# Patient Record
Sex: Female | Born: 1985 | Race: Black or African American | Hispanic: No | Marital: Single | Smoking: Never smoker
Health system: Southern US, Community
[De-identification: ages and names within clinical notes are randomized; demographics above are authoritative.]

## PROBLEM LIST (undated history)

## (undated) ENCOUNTER — Inpatient Hospital Stay (HOSPITAL_COMMUNITY): Payer: Self-pay

## (undated) DIAGNOSIS — R011 Cardiac murmur, unspecified: Secondary | ICD-10-CM

## (undated) DIAGNOSIS — R87629 Unspecified abnormal cytological findings in specimens from vagina: Secondary | ICD-10-CM

## (undated) DIAGNOSIS — M419 Scoliosis, unspecified: Secondary | ICD-10-CM

## (undated) HISTORY — PX: COLPOSCOPY: SHX161

## (undated) HISTORY — PX: DILATION AND CURETTAGE OF UTERUS: SHX78

---
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
## 2006-07-19 ENCOUNTER — Encounter: Admission: RE | Admit: 2006-07-19 | Discharge: 2006-07-19 | Payer: Self-pay | Admitting: Orthopaedic Surgery

## 2006-09-07 LAB — CONVERTED CEMR LAB

## 2006-09-11 ENCOUNTER — Other Ambulatory Visit: Admission: RE | Admit: 2006-09-11 | Discharge: 2006-09-11 | Payer: Self-pay | Admitting: Obstetrics and Gynecology

## 2007-10-18 ENCOUNTER — Ambulatory Visit: Payer: Self-pay | Admitting: Internal Medicine

## 2007-10-18 LAB — CONVERTED CEMR LAB
ALT: 12 units/L (ref 0–35)
Albumin: 3.7 g/dL (ref 3.5–5.2)
Alkaline Phosphatase: 58 units/L (ref 39–117)
BUN: 11 mg/dL (ref 6–23)
Basophils Relative: 0 % (ref 0.0–3.0)
Bilirubin, Direct: 0.2 mg/dL (ref 0.0–0.3)
CO2: 29 meq/L (ref 19–32)
Calcium: 9.2 mg/dL (ref 8.4–10.5)
Crystals: NEGATIVE
Eosinophils Absolute: 0.5 10*3/uL (ref 0.0–0.7)
Eosinophils Relative: 7 % — ABNORMAL HIGH (ref 0.0–5.0)
Glucose, Bld: 88 mg/dL (ref 70–99)
HCT: 37 % (ref 36.0–46.0)
Hemoglobin: 12.1 g/dL (ref 12.0–15.0)
Ketones, ur: NEGATIVE mg/dL
MCV: 82.1 fL (ref 78.0–100.0)
Monocytes Absolute: 0.8 10*3/uL (ref 0.1–1.0)
Monocytes Relative: 11.4 % (ref 3.0–12.0)
Neutro Abs: 2.8 10*3/uL (ref 1.4–7.7)
Potassium: 4.1 meq/L (ref 3.5–5.1)
RBC: 4.5 M/uL (ref 3.87–5.11)
Sodium: 139 meq/L (ref 135–145)
Total Protein, Urine: NEGATIVE mg/dL
Total Protein: 7.4 g/dL (ref 6.0–8.3)
Urine Glucose: NEGATIVE mg/dL
Urobilinogen, UA: 1 (ref 0.0–1.0)
WBC: 6.7 10*3/uL (ref 4.5–10.5)

## 2007-10-19 ENCOUNTER — Encounter: Payer: Self-pay | Admitting: Internal Medicine

## 2007-10-19 ENCOUNTER — Telehealth: Payer: Self-pay | Admitting: Internal Medicine

## 2007-11-17 ENCOUNTER — Ambulatory Visit: Payer: Self-pay | Admitting: Internal Medicine

## 2008-02-07 ENCOUNTER — Inpatient Hospital Stay (HOSPITAL_COMMUNITY): Admission: AD | Admit: 2008-02-07 | Discharge: 2008-02-07 | Payer: Self-pay | Admitting: Obstetrics and Gynecology

## 2008-02-29 ENCOUNTER — Other Ambulatory Visit: Admission: RE | Admit: 2008-02-29 | Discharge: 2008-02-29 | Payer: Self-pay | Admitting: Obstetrics and Gynecology

## 2008-07-03 ENCOUNTER — Other Ambulatory Visit: Admission: RE | Admit: 2008-07-03 | Discharge: 2008-07-03 | Payer: Self-pay | Admitting: Obstetrics and Gynecology

## 2008-09-06 ENCOUNTER — Inpatient Hospital Stay (HOSPITAL_COMMUNITY): Admission: AD | Admit: 2008-09-06 | Discharge: 2008-09-09 | Payer: Self-pay | Admitting: Obstetrics and Gynecology

## 2008-09-07 ENCOUNTER — Encounter (INDEPENDENT_AMBULATORY_CARE_PROVIDER_SITE_OTHER): Payer: Self-pay | Admitting: Obstetrics and Gynecology

## 2008-09-22 ENCOUNTER — Encounter: Admission: RE | Admit: 2008-09-22 | Discharge: 2008-10-21 | Payer: Self-pay | Admitting: Obstetrics and Gynecology

## 2008-10-22 ENCOUNTER — Encounter: Admission: RE | Admit: 2008-10-22 | Discharge: 2008-11-21 | Payer: Self-pay | Admitting: Obstetrics and Gynecology

## 2008-11-22 ENCOUNTER — Encounter: Admission: RE | Admit: 2008-11-22 | Discharge: 2008-12-21 | Payer: Self-pay | Admitting: Obstetrics and Gynecology

## 2008-12-07 ENCOUNTER — Other Ambulatory Visit: Admission: RE | Admit: 2008-12-07 | Discharge: 2008-12-07 | Payer: Self-pay | Admitting: Obstetrics and Gynecology

## 2008-12-22 ENCOUNTER — Encounter: Admission: RE | Admit: 2008-12-22 | Discharge: 2009-01-04 | Payer: Self-pay | Admitting: Obstetrics and Gynecology

## 2009-01-22 ENCOUNTER — Encounter: Admission: RE | Admit: 2009-01-22 | Discharge: 2009-02-21 | Payer: Self-pay | Admitting: Obstetrics and Gynecology

## 2009-02-22 ENCOUNTER — Encounter: Admission: RE | Admit: 2009-02-22 | Discharge: 2009-03-24 | Payer: Self-pay | Admitting: Obstetrics and Gynecology

## 2009-04-22 ENCOUNTER — Encounter: Admission: RE | Admit: 2009-04-22 | Discharge: 2009-05-22 | Payer: Self-pay | Admitting: Obstetrics and Gynecology

## 2009-05-18 ENCOUNTER — Ambulatory Visit: Payer: Self-pay | Admitting: Internal Medicine

## 2009-05-23 ENCOUNTER — Encounter: Admission: RE | Admit: 2009-05-23 | Discharge: 2009-06-22 | Payer: Self-pay | Admitting: Obstetrics and Gynecology

## 2009-06-23 ENCOUNTER — Encounter: Admission: RE | Admit: 2009-06-23 | Discharge: 2009-07-23 | Payer: Self-pay | Admitting: Obstetrics and Gynecology

## 2009-06-29 ENCOUNTER — Ambulatory Visit: Payer: Self-pay | Admitting: Internal Medicine

## 2009-06-29 LAB — CONVERTED CEMR LAB
ALT: 20 units/L (ref 0–35)
BUN: 11 mg/dL (ref 6–23)
Basophils Absolute: 0 10*3/uL (ref 0.0–0.1)
CO2: 29 meq/L (ref 19–32)
Chloride: 108 meq/L (ref 96–112)
Creatinine, Ser: 0.7 mg/dL (ref 0.4–1.2)
Eosinophils Absolute: 0.5 10*3/uL (ref 0.0–0.7)
Eosinophils Relative: 6.1 % — ABNORMAL HIGH (ref 0.0–5.0)
Glucose, Bld: 88 mg/dL (ref 70–99)
HCT: 39.5 % (ref 36.0–46.0)
Lymphs Abs: 3.2 10*3/uL (ref 0.7–4.0)
MCHC: 34.3 g/dL (ref 30.0–36.0)
MCV: 78.3 fL (ref 78.0–100.0)
Monocytes Absolute: 0.8 10*3/uL (ref 0.1–1.0)
Neutrophils Relative %: 42.8 % — ABNORMAL LOW (ref 43.0–77.0)
Platelets: 298 10*3/uL (ref 150.0–400.0)
Potassium: 4.6 meq/L (ref 3.5–5.1)
RDW: 14.3 % (ref 11.5–14.6)
Sed Rate: 10 mm/hr (ref 0–22)
TSH: 2.55 microintl units/mL (ref 0.35–5.50)
Total Bilirubin: 0.8 mg/dL (ref 0.3–1.2)
hCG, Beta Chain, Quant, S: <0.5 milliintl units/mL

## 2009-07-12 ENCOUNTER — Other Ambulatory Visit: Admission: RE | Admit: 2009-07-12 | Discharge: 2009-07-12 | Payer: Self-pay | Admitting: Obstetrics and Gynecology

## 2009-07-24 ENCOUNTER — Encounter: Admission: RE | Admit: 2009-07-24 | Discharge: 2009-08-23 | Payer: Self-pay | Admitting: Obstetrics and Gynecology

## 2009-07-27 ENCOUNTER — Ambulatory Visit: Payer: Self-pay | Admitting: Internal Medicine

## 2009-08-24 ENCOUNTER — Encounter: Admission: RE | Admit: 2009-08-24 | Discharge: 2009-09-23 | Payer: Self-pay | Admitting: Obstetrics and Gynecology

## 2009-09-24 ENCOUNTER — Encounter: Admission: RE | Admit: 2009-09-24 | Discharge: 2009-09-26 | Payer: Self-pay | Admitting: Obstetrics and Gynecology

## 2010-02-07 NOTE — Assessment & Plan Note (Signed)
Summary: 1 mos f/u #/cd   Vital Signs:  Patient profile:   25 year old female Height:      62 inches Weight:      162 pounds O2 Sat:      97 % on Room air Temp:     97.7 degrees F oral Pulse rate:   78 / minute Pulse rhythm:   regular Resp:     16 per minute BP sitting:   110 / 72  (left arm) Cuff size:   large  Vitals Entered By: Rock Nephew CMA (July 27, 2009 11:35 AM)  O2 Flow:  Room air  Primary Care Provider:  Etta Grandchild MD   History of Present Illness: She feels well and says that her headaches have resolved and she no N/W/T in her arms or legs. She has decided no to do the MRI and has not benn needing any meds for pain.  Preventive Screening-Counseling & Management  Alcohol-Tobacco     Alcohol drinks/day: 0     Smoking Status: never     Passive Smoke Exposure: no  Hep-HIV-STD-Contraception     Hepatitis Risk: no risk noted     HIV Risk: no     HIV Risk Counseling: not indicated-no HIV risk noted     STD Risk: no risk noted  Clinical Review Panels:  Diabetes Management   Creatinine:  0.7 (06/29/2009)  CBC   WBC:  7.9 (06/29/2009)   RBC:  5.04 (06/29/2009)   Hgb:  13.6 (06/29/2009)   Hct:  39.5 (06/29/2009)   Platelets:  298.0 (06/29/2009)   MCV  78.3 (06/29/2009)   MCHC  34.3 (06/29/2009)   RDW  14.3 (06/29/2009)   PMN:  42.8 (06/29/2009)   Lymphs:  40.2 (06/29/2009)   Monos:  10.3 (06/29/2009)   Eosinophils:  6.1 (06/29/2009)   Basophil:  0.6 (06/29/2009)  Complete Metabolic Panel   Glucose:  88 (06/29/2009)   Sodium:  141 (06/29/2009)   Potassium:  4.6 (06/29/2009)   Chloride:  108 (06/29/2009)   CO2:  29 (06/29/2009)   BUN:  11 (06/29/2009)   Creatinine:  0.7 (06/29/2009)   Albumin:  4.2 (06/29/2009)   Total Protein:  8.1 (06/29/2009)   Calcium:  9.5 (06/29/2009)   Total Bili:  0.8 (06/29/2009)   Alk Phos:  65 (06/29/2009)   SGPT (ALT):  20 (06/29/2009)   SGOT (AST):  25 (06/29/2009)   Medications Prior to Update: 1)   None  Current Medications (verified): 1)  None  Allergies (verified): No Known Drug Allergies  Past History:  Past Medical History: Last updated: 10/18/2007 splenomegaly for 10 years Innocent murmur  Past Surgical History: Last updated: 10/18/2007 Denies surgical history  Family History: Last updated: 10/18/2007 Family History of Arthritis Family History Diabetes 1st degree relative Family History Hypertension  Social History: Last updated: 06/29/2009 Single Never Smoked Alcohol use-no Drug use-no Regular exercise-yes  Risk Factors: Alcohol Use: 0 (07/27/2009) Exercise: yes (10/18/2007)  Risk Factors: Smoking Status: never (07/27/2009) Passive Smoke Exposure: no (07/27/2009)  Family History: Reviewed history from 10/18/2007 and no changes required. Family History of Arthritis Family History Diabetes 1st degree relative Family History Hypertension  Social History: Reviewed history from 06/29/2009 and no changes required. Single Never Smoked Alcohol use-no Drug use-no Regular exercise-yes  Review of Systems Neuro:  Denies brief paralysis, headaches, numbness, poor balance, seizures, sensation of room spinning, tremors, visual disturbances, and weakness.  Physical Exam  General:  alert, well-developed, well-nourished, well-hydrated, appropriate dress, normal appearance,  healthy-appearing, and cooperative to examination.   Head:  normocephalic, atraumatic, no abnormalities observed, and no abnormalities palpated.   Mouth:  Oral mucosa and oropharynx without lesions or exudates.  Teeth in good repair. Neck:  supple, full ROM, no masses, and no thyromegaly.   Lungs:  Normal respiratory effort, chest expands symmetrically. Lungs are clear to auscultation, no crackles or wheezes. Heart:   normal rate, regular rhythm, no gallop, no rub, and Grade   1/6 systolic ejection murmur.   Abdomen:  soft, non-tender, normal bowel sounds, no distention, no masses, no  guarding, no rigidity, no rebound tenderness, no abdominal hernia, no inguinal hernia, no hepatomegaly, and no splenomegaly.   Msk:  normal ROM, no joint tenderness, no joint swelling, no joint warmth, no redness over joints, and no joint deformities.   Extremities:  No clubbing, cyanosis, edema, or deformity noted with normal full range of motion of all joints.   Neurologic:  alert & oriented X3, cranial nerves II-XII intact, strength normal in all extremities, sensation intact to light touch, sensation intact to pinprick, gait normal, and DTRs symmetrical and normal.   Skin:  turgor normal, color normal, no rashes, no suspicious lesions, no ecchymoses, no petechiae, no purpura, no ulcerations, no edema, and tattoo(s).   Cervical Nodes:  no anterior cervical adenopathy and no posterior cervical adenopathy.   Psych:  Cognition and judgment appear intact. Alert and cooperative with normal attention span and concentration. No apparent delusions, illusions, hallucinations   Impression & Recommendations:  Problem # 1:  WEAKNESS, LEFT SIDE OF BODY (ICD-342.90) Assessment Improved she will let me know if this symptom returns and/or she decides that she wants to do the MRI.  Problem # 2:  HEADACHE (ICD-784.0) Assessment: Improved  Patient Instructions: 1)  Please schedule a follow-up appointment as needed.

## 2010-02-07 NOTE — Assessment & Plan Note (Signed)
Summary: BUMP MAY BE INFECTED/NWS  #   Vital Signs:  Patient profile:   25 year old female Weight:      162 pounds O2 Sat:      97 % on Room air Temp:     98.7 degrees F oral Pulse rate:   90 / minute Pulse rhythm:   regular Resp:     16 per minute BP sitting:   120 / 80  (left arm) Cuff size:   large  Vitals Entered By: Rock Nephew CMA (May 18, 2009 11:36 AM)  O2 Flow:  Room air  Primary Care Provider:  Etta Grandchild MD   History of Present Illness: She returns c/o a bump on her left forearm for about two weeks with pain and itching. She has applied neosporin ointment and it is resolving over the last 2 days. It is a red place that has not enlarged, drained, streaked, or opened.  Preventive Screening-Counseling & Management  Alcohol-Tobacco     Alcohol drinks/day: 0     Smoking Status: never     Passive Smoke Exposure: no  Hep-HIV-STD-Contraception     Hepatitis Risk: no risk noted     HIV Risk: no     HIV Risk Counseling: not indicated-no HIV risk noted      Drug Use:  no.    Medications Prior to Update: 1)  None  Current Medications (verified): 1)  None  Allergies (verified): No Known Drug Allergies  Past History:  Past Medical History: Reviewed history from 10/18/2007 and no changes required. splenomegaly for 10 years Innocent murmur  Past Surgical History: Reviewed history from 10/18/2007 and no changes required. Denies surgical history  Family History: Reviewed history from 10/18/2007 and no changes required. Family History of Arthritis Family History Diabetes 1st degree relative Family History Hypertension  Social History: Reviewed history from 10/18/2007 and no changes required. she is with her baby boy today Single Never Smoked Alcohol use-no Drug use-no Regular exercise-yes Hepatitis Risk:  no risk noted  Review of Systems  The patient denies anorexia, fever, syncope, prolonged cough, and enlarged lymph nodes.   Derm:   Complains of insect bite(s) and itching; denies flushing, lesion(s), poor wound healing, and rash.  Physical Exam  General:  alert, well-developed, well-nourished, well-hydrated, appropriate dress, normal appearance, healthy-appearing, and cooperative to examination.   Neck:  supple, full ROM, no masses, and no thyromegaly.   Lungs:  Normal respiratory effort, chest expands symmetrically. Lungs are clear to auscultation, no crackles or wheezes. Heart:  Normal rate and regular rhythm. S1 and S2 normal without gallop, murmur, click, rub or other extra sounds. Abdomen:  soft, non-tender, normal bowel sounds, no distention, no masses, no guarding, no rigidity, no rebound tenderness, no abdominal hernia, no inguinal hernia, no hepatomegaly, and no splenomegaly.   Skin:  on her left forearm there is a subtle erythematous lesion that measures about 3 mm. It is not warm, indurated, ttp, streaking, or vesicular. the skin is intact with no pustules or scale and no FB. Axillary Nodes:  no L axillary adenopathy.     Impression & Recommendations:  Problem # 1:  OTH MX&UNS SITE INSECT BITE NONVENOMOUS W/O INF (ICD-919.4) Assessment New apply otc hydrocortisone cream to the AA.  Patient Instructions: 1)  Please schedule a follow-up appointment as needed. Apply cortaid cream to the bug bite.

## 2010-02-07 NOTE — Assessment & Plan Note (Signed)
Summary: CPX/ HAVING HEADACHES/NWS  #   Vital Signs:  Patient profile:   25 year old female Height:      62 inches Weight:      164 pounds BMI:     30.10 O2 Sat:      96 % on Room air Temp:     98.1 degrees F oral Pulse rate:   99 / minute Pulse rhythm:   regular Resp:     16 per minute BP sitting:   120 / 82  (left arm) Cuff size:   large  Vitals Entered By: Rock Nephew CMA (June 29, 2009 9:12 AM)  Nutrition Counseling: Patient's BMI is greater than 25 and therefore counseled on weight management options.  O2 Flow:  Room air CC: CPX w/o pap, pt also c/o recent headaches w/ some nausea, Headaches Is Patient Diabetic? No Pain Assessment Patient in pain? yes     Location: head   Primary Care Provider:  Etta Grandchild MD  CC:  CPX w/o pap, pt also c/o recent headaches w/ some nausea, and Headaches.  History of Present Illness:  Headaches      This is a 25 year old woman who presents with Headaches.  The symptoms began 6-12 months ago.  On a scale of 1 to 10, the intensity is described as a 2.  The patient reports nausea and nasal congestion, but denies vomiting, sweats, tearing of eyes, sinus pain, sinus pressure, photophobia, and phonophobia.  The headache is described as constant and dull.  The location of the pain is bitemporal.  High-risk features (red flags) include focal weakness and new type of headache.  The patient denies the following high-risk features: fever, neck pain/stiffness, vision loss or change, altered mental status, rash, trauma, pain worse with exertion, age >50 years, immunosuppression, concomitant infection, and anticoagulation use.  Prior treatment has included a NSAID and acetaminophen.    Preventive Screening-Counseling & Management  Alcohol-Tobacco     Alcohol drinks/day: 0     Smoking Status: never     Passive Smoke Exposure: no  Hep-HIV-STD-Contraception     Hepatitis Risk: no risk noted     HIV Risk: no     HIV Risk Counseling: not  indicated-no HIV risk noted     STD Risk: no risk noted      Sexual History:  Not active.        Drug Use:  never.        Blood Transfusions:  no.    Medications Prior to Update: 1)  None  Current Medications (verified): 1)  None  Allergies (verified): No Known Drug Allergies  Past History:  Past Medical History: Last updated: 10/18/2007 splenomegaly for 10 years Innocent murmur  Past Surgical History: Last updated: 10/18/2007 Denies surgical history  Family History: Last updated: 10/18/2007 Family History of Arthritis Family History Diabetes 1st degree relative Family History Hypertension  Social History: Last updated: 06/29/2009 Single Never Smoked Alcohol use-no Drug use-no Regular exercise-yes  Risk Factors: Alcohol Use: 0 (06/29/2009) Exercise: yes (10/18/2007)  Risk Factors: Smoking Status: never (06/29/2009) Passive Smoke Exposure: no (06/29/2009)  Social History: Single Never Smoked Alcohol use-no Drug use-no Regular exercise-yes STD Risk:  no risk noted Sexual History:  Not active Drug Use:  never Blood Transfusions:  no  Review of Systems       The patient complains of muscle weakness.  The patient denies anorexia, fever, weight loss, weight gain, chest pain, peripheral edema, prolonged cough, hemoptysis, abdominal pain,  melena, suspicious skin lesions, transient blindness, difficulty walking, and depression.   Neuro:  Complains of headaches and weakness; denies brief paralysis, difficulty with concentration, disturbances in coordination, falling down, inability to speak, memory loss, numbness, poor balance, seizures, sensation of room spinning, tingling, tremors, and visual disturbances.  Physical Exam  General:  alert, well-developed, well-nourished, well-hydrated, appropriate dress, normal appearance, healthy-appearing, and cooperative to examination.   Head:  normocephalic, atraumatic, no abnormalities observed, and no abnormalities  palpated.   Eyes:  vision grossly intact, pupils equal, pupils round, pupils reactive to light, pupils react to accomodation, no retinal abnormalitiies, and no nystagmus.   Ears:  R ear normal and L ear normal.   Nose:  External nasal examination shows no deformity or inflammation. Nasal mucosa are pink and moist without lesions or exudates. Mouth:  Oral mucosa and oropharynx without lesions or exudates.  Teeth in good repair. Neck:  supple, full ROM, no masses, and no thyromegaly.   Lungs:  Normal respiratory effort, chest expands symmetrically. Lungs are clear to auscultation, no crackles or wheezes. Heart:   normal rate, regular rhythm, no gallop, no rub, and Grade   1/6 systolic ejection murmur.   Abdomen:  soft, non-tender, normal bowel sounds, no distention, no masses, no guarding, no rigidity, no rebound tenderness, no abdominal hernia, no inguinal hernia, no hepatomegaly, and no splenomegaly.   Msk:  normal ROM, no joint tenderness, no joint swelling, no joint warmth, no redness over joints, and no joint deformities.   Pulses:  R and L carotid,radial,femoral,dorsalis pedis and posterior tibial pulses are full and equal bilaterally Extremities:  No clubbing, cyanosis, edema, or deformity noted with normal full range of motion of all joints.   Neurologic:  alert & oriented X3, cranial nerves II-XII intact, sensation intact to light touch, sensation intact to pinprick, gait normal, DTRs symmetrical and normal, finger-to-nose normal, heel-to-shin normal, toes down bilaterally on Babinski, Romberg negative, and LUE weakness.   Skin:  turgor normal, color normal, no rashes, no suspicious lesions, no ecchymoses, no petechiae, no purpura, no ulcerations, no edema, and tattoo(s).   Cervical Nodes:  no anterior cervical adenopathy and no posterior cervical adenopathy.   Axillary Nodes:  no R axillary adenopathy and no L axillary adenopathy.   Inguinal Nodes:  no R inguinal adenopathy and no L inguinal  adenopathy.   Psych:  Cognition and judgment appear intact. Alert and cooperative with normal attention span and concentration. No apparent delusions, illusions, hallucinations   Impression & Recommendations:  Problem # 1:  HEADACHE (ICD-784.0) Assessment New this sounds like rebound HA from chronic use of OTC meds like tylenol and nsaids but her LUE weakness is concerning so she will need a brain MRI to look for avm, tumor, mass, etc. I will also look at labs today to screen for secondary organic causes. she does not request treatment for pain at this time but may start topamax if needed. Orders: Venipuncture (04540) TLB-BMP (Basic Metabolic Panel-BMET) (80048-METABOL) TLB-CBC Platelet - w/Differential (85025-CBCD) TLB-Hepatic/Liver Function Pnl (80076-HEPATIC) TLB-TSH (Thyroid Stimulating Hormone) (84443-TSH) TLB-Preg Serum Quant (B-hCG) (84702-HCG-QN) TLB-Sedimentation Rate (ESR) (85652-ESR) TLB-CRP-High Sensitivity (C-Reactive Protein) (86140-FCRP)  Problem # 2:  WEAKNESS, LEFT SIDE OF BODY (ICD-342.90) Assessment: New as above Orders: Venipuncture ([id number]) TLB-BMP (Basic Metabolic Panel-BMET) (80048-METABOL) TLB-CBC Platelet - w/Differential (85025-CBCD) TLB-Hepatic/Liver Function Pnl (80076-HEPATIC) TLB-TSH (Thyroid Stimulating Hormone) (84443-TSH) TLB-Preg Serum Quant (B-hCG) (84702-HCG-QN) TLB-Sedimentation Rate (ESR) (85652-ESR) TLB-CRP-High Sensitivity (C-Reactive Protein) (86140-FCRP)  Patient Instructions: 1)  Please schedule a follow-up appointment in  1 month. 2)  It is important that you exercise regularly at least 20 minutes 5 times a week. If you develop chest pain, have severe difficulty breathing, or feel very tired , stop exercising immediately and seek medical attention. 3)  You need to lose weight. Consider a lower calorie diet and regular exercise.   Preventive Care Screening  Pap Smear:    Date:  11/06/2008    Results:  normal   Last Tetanus Booster:     Date:  01/07/2004    Results:  Historical

## 2010-04-12 LAB — CBC
HCT: 34.4 % — ABNORMAL LOW (ref 36.0–46.0)
Hemoglobin: 11.6 g/dL — ABNORMAL LOW (ref 12.0–15.0)
MCHC: 33.9 g/dL (ref 30.0–36.0)
MCV: 87.1 fL (ref 78.0–100.0)
MCV: 88.5 fL (ref 78.0–100.0)
Platelets: 200 10*3/uL (ref 150–400)
RBC: 3.88 MIL/uL (ref 3.87–5.11)
WBC: 12.8 10*3/uL — ABNORMAL HIGH (ref 4.0–10.5)

## 2010-04-12 LAB — URINALYSIS, DIPSTICK ONLY
Bilirubin Urine: NEGATIVE
Hgb urine dipstick: NEGATIVE
Ketones, ur: 40 mg/dL — AB
Specific Gravity, Urine: 1.02 (ref 1.005–1.030)
pH: 6.5 (ref 5.0–8.0)

## 2010-04-12 LAB — COMPREHENSIVE METABOLIC PANEL
AST: 27 U/L (ref 0–37)
Albumin: 3.2 g/dL — ABNORMAL LOW (ref 3.5–5.2)
Calcium: 9 mg/dL (ref 8.4–10.5)
Chloride: 102 mEq/L (ref 96–112)
Creatinine, Ser: 0.52 mg/dL (ref 0.4–1.2)
GFR calc Af Amer: >60 mL/min (ref >60)
Sodium: 133 mEq/L — ABNORMAL LOW (ref 135–145)
Total Bilirubin: 0.7 mg/dL (ref 0.3–1.2)

## 2010-04-23 LAB — CBC
HCT: 33 % — ABNORMAL LOW (ref 36.0–46.0)
Platelets: 242 10*3/uL (ref 150–400)
RBC: 4 MIL/uL (ref 3.87–5.11)
WBC: 11.6 10*3/uL — ABNORMAL HIGH (ref 4.0–10.5)

## 2010-04-23 LAB — URINALYSIS, ROUTINE W REFLEX MICROSCOPIC
Bilirubin Urine: NEGATIVE
Glucose, UA: NEGATIVE mg/dL
Ketones, ur: 15 mg/dL — AB
Protein, ur: NEGATIVE mg/dL

## 2010-04-23 LAB — GC/CHLAMYDIA PROBE AMP, GENITAL: GC Probe Amp, Genital: NEGATIVE

## 2010-04-23 LAB — URINE MICROSCOPIC-ADD ON

## 2010-04-23 LAB — WET PREP, GENITAL
Trich, Wet Prep: NONE SEEN
Yeast Wet Prep HPF POC: NONE SEEN

## 2010-05-21 NOTE — H&P (Signed)
Katie Rosales, Katie Rosales NO.:  [id number]   MEDICAL RECORD NO.:  [id number]          PATIENT TYPE:  SPE   LOCATION:  [facility]                         FACILITY:  [facility]   PHYSICIAN:  Gerald Leitz, MD          DATE OF BIRTH:  1985-04-23   DATE OF ADMISSION:  09/06/2008  DATE OF DISCHARGE:                              HISTORY & PHYSICAL   HISTORY OF PRESENT ILLNESS:  This is a 25 year old G2, P0-0-1-0 at 38  weeks and 4 days intrauterine pregnancy based on the last menstrual  period of December 11, 2007 confirmed by 9-week ultrasound with estimated  date of delivery September 16, 2008.  Pregnancy was complicated by  presenting to our office today with a blood pressure of 128/100  complaining of a headache unrelieved with Tylenol.  She also has  borderline oligohydramnios with an AFI of 5.  Positive fetal movement.  No leakage of fluid.  No contractions.   PAST MEDICAL HISTORY:  Scoliosis.   PAST SURGICAL HISTORY:  D and C in 2003 for an elective abortion.   PAST OB HISTORY:  Elective abortion in 2003.  Current pregnancy.   MEDICATIONS:  Prenatal vitamin.   ALLERGIES:  No known drug allergies.   PAST GYN HISTORY:  Chlamydia treated in 2008, history of cervical  dysplasia in 2008 as well.  Her last Pap smear was low grade SIL that  was done in February of 2010.   FAMILY HISTORY:  Sister with sickle cell trait.   SOCIAL HISTORY:  The patient is single.  She denies tobacco, alcohol, or  other drug use.   REVIEW OF SYSTEMS:  Negative, except as stated in history of present  illness.   PHYSICAL EXAMINATION:  VITAL SIGNS:  Blood pressure 138/100, weight 180  pounds, height 61-1/2 inches.  CARDIOVASCULAR:  Regular rate and rhythm.  LUNGS:  Clear to auscultation bilaterally.  ABDOMEN:  Gravid, nontender.  EXTREMITIES:  With 1+ edema.  2+ reflexes.  PELVIC:  Cervix is 2 cm and 50% effaced.  Negative 1 station.   ASSESSMENT AND PLAN:  A 38-week-4-day intrauterine pregnancy  with  gestational hypertension.  Will admit to labor and delivery for  induction.   PLAN:  Will do Cytotec followed by low-dose pitocin.  Check labs and PIH  panel.  Penicillin for GBS prophylaxis in active labor or with SROM.  Anticipate spontaneous vaginal delivery, Dr. Antoine Poche covering until  5 a.m. September 2.      Gerald Leitz, MD  Electronically Signed     TC/MEDQ  D:  09/06/2008  T:  09/06/2008  Job:  [id number]

## 2011-03-31 ENCOUNTER — Telehealth (HOSPITAL_COMMUNITY): Payer: Self-pay | Admitting: *Deleted

## 2011-03-31 NOTE — Telephone Encounter (Signed)
Trying to locate pt info

## 2012-07-22 ENCOUNTER — Other Ambulatory Visit: Payer: Self-pay | Admitting: Obstetrics and Gynecology

## 2012-07-23 ENCOUNTER — Ambulatory Visit (HOSPITAL_COMMUNITY): Admission: RE | Admit: 2012-07-23 | Payer: 59 | Source: Ambulatory Visit | Admitting: Obstetrics and Gynecology

## 2012-07-29 ENCOUNTER — Ambulatory Visit: Payer: Self-pay | Admitting: Obstetrics & Gynecology

## 2012-08-02 ENCOUNTER — Encounter (HOSPITAL_COMMUNITY): Admission: RE | Payer: Self-pay | Source: Ambulatory Visit

## 2012-08-02 SURGERY — DILATION AND EVACUATION, UTERUS
Anesthesia: Choice

## 2013-12-05 LAB — OB RESULTS CONSOLE GC/CHLAMYDIA
Chlamydia: NEGATIVE
GC PROBE AMP, GENITAL: NEGATIVE

## 2013-12-05 LAB — OB RESULTS CONSOLE HGB/HCT, BLOOD
HCT: 36 %
HEMOGLOBIN: 12.2 g/dL

## 2013-12-05 LAB — OB RESULTS CONSOLE PLATELET COUNT: PLATELETS: 305 10*3/uL

## 2013-12-05 LAB — OB RESULTS CONSOLE HEPATITIS B SURFACE ANTIGEN: HEP B S AG: NEGATIVE

## 2013-12-05 LAB — OB RESULTS CONSOLE ABO/RH: RH TYPE: POSITIVE

## 2013-12-05 LAB — OB RESULTS CONSOLE ANTIBODY SCREEN: Antibody Screen: NEGATIVE

## 2013-12-05 LAB — OB RESULTS CONSOLE HIV ANTIBODY (ROUTINE TESTING): HIV: NONREACTIVE

## 2013-12-05 LAB — OB RESULTS CONSOLE RPR: RPR: NONREACTIVE

## 2013-12-05 LAB — OB RESULTS CONSOLE RUBELLA ANTIBODY, IGM: Rubella: IMMUNE

## 2014-01-06 NOTE — L&D Delivery Note (Signed)
2241: Nurse call reporting patient crying and reporting contractions have worsened despite procardia dosing.  In room to assess.  Patient appears distressed and reporting constant vaginal pressure.  VE C/C/+4.  Patient informed of imminent delivery and necessary personnell contacted.  Patient moved to labor room and upon NICU arrival, patient instructed on pushing methods.   FHR attempted and unsuccessfully obtained.  Patient delivered as below with staff and family support.   Delivery Note At 10:55 PM, April 24, 2014, a viable female "[name]" was delivered via Vaginal, Spontaneous Delivery (Presentation: Left Occiput Anterior). After delivery of head, nuchal cord noted and infant delivered through, easily, via somersault maneuver.  Infant with good tone and attempting to cry.  Cord clamped and cut by provider.  Infant than walked to warmer, by provider, for NICU team assessment.  Infant with spontaneous cry upon placement in warmer and given APGARs: 6, 7.  Cord blood collected and placenta remained undelivered.  Pitocin bolus started 15 minutes after delivery to promote placenta expulsion.  After one hour of active management, Dr. Delrae Sawyers. Cole consulted regarding placenta delivery.  Recommendation to give hemabate 250 mcg and await 30 minutes for expulsion.  Hemabate given per recommendation.  Patient reporting urge to urinate and placed on bedpan. Despite warm water and peppermint, patient unable to urinate.  Provider gave manual external compression of bladder, which elicited urination.  Signs of placenta separation noted and patient urged to push.  Placenta delivered at 0030 and noted to be intact with marginal 3VC upon inspection.  Vaginal inspection revealed small hemostatic abrasions and patient informed of labial and perineal location as well as care techniques.  Fundus firm, at the umbilicus, and bleeding small.  Mother hemodynamically stable prior to provider exit.  Mother unsure of birth control method and  opts to bottlefeed.  Infant weight at one hour of life: 3 lb 13.7 oz (1750 g).    Anesthesia: None  Episiotomy: None Lacerations: None Suture Repair: None Est. Blood Loss (mL): 531 Placenta status: Intact, Pathology  Mom to Women's Unit.  Baby to NICU.  Dorotha Hirschi LYNN MSN, CNM 04/25/2014, 1:01 AM

## 2014-03-06 ENCOUNTER — Encounter (HOSPITAL_COMMUNITY): Payer: Self-pay | Admitting: *Deleted

## 2014-03-06 ENCOUNTER — Inpatient Hospital Stay (HOSPITAL_COMMUNITY): Payer: PRIVATE HEALTH INSURANCE

## 2014-03-06 ENCOUNTER — Inpatient Hospital Stay (HOSPITAL_COMMUNITY)
Admission: AD | Admit: 2014-03-06 | Discharge: 2014-03-06 | Disposition: A | Discharge status: Home / Self Care | Payer: PRIVATE HEALTH INSURANCE | Source: Ambulatory Visit | Attending: Obstetrics and Gynecology | Admitting: Obstetrics and Gynecology

## 2014-03-06 DIAGNOSIS — Z3A24 24 weeks gestation of pregnancy: Secondary | ICD-10-CM | POA: Insufficient documentation

## 2014-03-06 DIAGNOSIS — R109 Unspecified abdominal pain: Secondary | ICD-10-CM | POA: Insufficient documentation

## 2014-03-06 DIAGNOSIS — O4692 Antepartum hemorrhage, unspecified, second trimester: Secondary | ICD-10-CM

## 2014-03-06 DIAGNOSIS — O26852 Spotting complicating pregnancy, second trimester: Secondary | ICD-10-CM | POA: Diagnosis present

## 2014-03-06 DIAGNOSIS — O9989 Other specified diseases and conditions complicating pregnancy, childbirth and the puerperium: Secondary | ICD-10-CM | POA: Diagnosis not present

## 2014-03-06 HISTORY — DX: Scoliosis, unspecified: M41.9

## 2014-03-06 HISTORY — DX: Cardiac murmur, unspecified: R01.1

## 2014-03-06 HISTORY — DX: Unspecified abnormal cytological findings in specimens from vagina: R87.629

## 2014-03-06 LAB — URINALYSIS, ROUTINE W REFLEX MICROSCOPIC
BILIRUBIN URINE: NEGATIVE
GLUCOSE, UA: NEGATIVE mg/dL
KETONES UR: 15 mg/dL — AB
Nitrite: NEGATIVE
PROTEIN: NEGATIVE mg/dL
Specific Gravity, Urine: 1.025 (ref 1.005–1.030)
UROBILINOGEN UA: 1 mg/dL (ref 0.0–1.0)
pH: 6 (ref 5.0–8.0)

## 2014-03-06 LAB — URINE MICROSCOPIC-ADD ON

## 2014-03-06 MED ORDER — ACETAMINOPHEN 325 MG PO TABS: 650.0000 mg | ORAL_TABLET | Freq: Once | ORAL | Status: DC

## 2014-03-06 MED ORDER — IBUPROFEN 600 MG PO TABS: 600.0000 mg | ORAL_TABLET | Freq: Four times a day (QID) | ORAL | Status: DC | PRN

## 2014-03-06 MED ORDER — ACETAMINOPHEN 500 MG PO TABS
1000.0000 mg | ORAL_TABLET | Freq: Once | ORAL | Status: AC
  Administered 2014-03-06: 1000 mg via ORAL
  Filled 2014-03-06: qty 2

## 2014-03-06 NOTE — MAU Note (Signed)
Urine in lab,. 

## 2014-03-06 NOTE — Discharge Instructions (Signed)
Pelvic Rest °Pelvic rest is sometimes recommended for women when:  °· The placenta is partially or completely covering the opening of the cervix (placenta previa). °· There is bleeding between the uterine wall and the amniotic sac in the first trimester (subchorionic hemorrhage). °· The cervix begins to open without labor starting (incompetent cervix, cervical insufficiency). °· The labor is too early (preterm labor). °HOME CARE INSTRUCTIONS °· Do not have sexual intercourse, stimulation, or an orgasm. °· Do not use tampons, douche, or put anything in the vagina. °· Do not lift anything over 10 pounds (4.5 kg). °· Avoid strenuous activity or straining your pelvic muscles. °SEEK MEDICAL CARE IF:  °· You have any vaginal bleeding during pregnancy. Treat this as a potential emergency. °· You have cramping pain felt low in the stomach (stronger than menstrual cramps). °· You notice vaginal discharge (watery, mucus, or bloody). °· You have a low, dull backache. °· There are regular contractions or uterine tightening. °SEEK IMMEDIATE MEDICAL CARE IF: °You have vaginal bleeding and have placenta previa.  °Document Released: 04/19/2010 Document Revised: 03/17/2011 Document Reviewed: 04/19/2010 °ExitCare® Patient Information ©2015 [employer]. This information is not intended to replace advice given to you by your health care provider. Make sure you discuss any questions you have with your health care provider. ° °Vaginal Bleeding During Pregnancy, Second Trimester ° A small amount of bleeding (spotting) from the vagina is common in pregnancy. Sometimes the bleeding is normal and is not a problem, and sometimes it is a sign of something serious. Be sure to tell your doctor about any bleeding from your vagina right away. °HOME CARE °· Watch your condition for any changes. °· Follow your doctor's instructions about how active you can be. °· If you are on bed rest: °¨ You may need to stay in bed and only get up to use the  bathroom. °¨ You may be allowed to do some activities. °¨ If you need help, make plans for someone to help you. °· Write down: °¨ The number of pads you use each day. °¨ How often you change pads. °¨ How soaked (saturated) your pads are. °· Do not use tampons. °· Do not douche. °· Do not have sex or orgasms until your doctor says it is okay. °· If you pass any tissue from your vagina, save the tissue so you can show it to your doctor. °· Only take medicines as told by your doctor. °· Do not take aspirin because it can make you bleed. °· Do not exercise, lift heavy weights, or do any activities that take a lot of energy and effort unless your doctor says it is okay. °· Keep all follow-up visits as told by your doctor. °GET HELP IF:  °· You bleed from your vagina. °· You have cramps. °· You have labor pains. °· You have a fever that does not go away after you take medicine. °GET HELP RIGHT AWAY IF: °· You have very bad cramps in your back or belly (abdomen). °· You have contractions. °· You have chills. °· You pass large clots or tissue from your vagina. °· You bleed more. °· You feel light-headed or weak. °· You pass out (faint). °· You are leaking fluid or have a gush of fluid from your vagina. °MAKE SURE YOU: °· Understand these instructions. °· Will watch your condition. °· Will get help right away if you are not doing well or get worse. °Document Released: 05/09/2013 Document Reviewed: 08/30/2012 °ExitCare® Patient Information ©2015   [employer]. This information is not intended to replace advice given to you by your health care provider. Make sure you discuss any questions you have with your health care provider. ° °

## 2014-03-06 NOTE — Progress Notes (Signed)
FFN from office reported to CNM as negative.

## 2014-03-06 NOTE — MAU Provider Note (Signed)
Continuation of care from [facility]. Standard.  U/S: Breech, anterior placenta, marginal insertion, cvx 3.43 cm (normal appearance), subjectively normal amniotic fluid; largest pocket = 5.15 cm, uterus: no abnormality visualized, no free fluid.   Cvx rechecked: 0/0/-4. No bleeding noted to exam glove.  1 gram of Tylenol given in MAU w/ good results.  P: Ibuprofen prn PTL precautions Monthly growth scans beginning at 28 wks due to marginal cord insertion OB f/u prn  Sherre ScarletKimberly Maylen Waltermire, CNM 03/06/14, 09:30 PM

## 2014-03-06 NOTE — MAU Provider Note (Signed)
Katie Rosales is a 29 y.o. G4P1021 at 24.3 weeks pt Katie FritzLakasha Rosales called.  She was in the office c/o having one episode of spotting this am with continuous cramping. Pt denies any recent lab test or hospital visit. pg,cma A/P: Cervix: soft, friable, external os 1 cm, closed internally. FFN/GC/CT/CBC: pending. Wet prep: negative. NST: contractions q2-4 minutes. Pt report only feeling 2 ctx since she arrived   History     Patient Active Problem List   Diagnosis Date Noted  . WEAKNESS, LEFT SIDE OF BODY 06/29/2009  . HEADACHE 06/29/2009  . CONSTIPATION 10/18/2007  . HEART MURMUR, BENIGN 10/18/2007    Chief Complaint  Patient presents with  . Abdominal Cramping   HPI  OB History    Gravida Para Term Preterm AB TAB SAB Ectopic Multiple Living   4 1 1  2 1 1   1       Past Medical History  Diagnosis Date  . Vaginal Pap smear, abnormal     Past Surgical History  Procedure Laterality Date  . Colposcopy    . Dilation and curettage of uterus      History reviewed. No pertinent family history.  History  Substance Use Topics  . Smoking status: Never Smoker   . Smokeless tobacco: Never Used  . Alcohol Use: No    Allergies: No Known Allergies  Prescriptions prior to admission  Medication Sig Dispense Refill Last Dose  . Prenatal Vit-Fe Fumarate-FA (PRENATAL MULTIVITAMIN) TABS tablet Take 1 tablet by mouth daily at 12 noon.   03/06/2014 at Unknown time    ROS See HPI above, all other systems are negative  Physical Exam   Blood pressure 126/78, pulse 70, temperature 98.7 F (37.1 C), temperature source Oral, resp. rate 16, height 5\' 1"  (1.549 m), weight 184 lb (83.462 kg).   Physical Exam Ext:  WNL ABD: Soft, non tender to palpation, no rebound or guarding SVE: deferred, closed and soft in the office today    ED Course  Assessment: IUP at  24.3weeks Membranes: intact FHR: Category 1 CTX:  Non observed fFN negative   Plan: OB US for bleeding Fetal  monitoring Report to [employer]   Katie Rosales, CNM, MSN 03/06/2014. 6:15 PM

## 2014-03-06 NOTE — MAU Note (Signed)
Had some cramping, they checked her cervix - was a little red and raw; ctx's noted on monitor.  Sent over for eval.

## 2014-04-13 ENCOUNTER — Inpatient Hospital Stay (HOSPITAL_COMMUNITY): Payer: PRIVATE HEALTH INSURANCE

## 2014-04-13 ENCOUNTER — Inpatient Hospital Stay (HOSPITAL_COMMUNITY)
Admission: AD | Admit: 2014-04-13 | Discharge: 2014-04-13 | Disposition: A | Discharge status: Home / Self Care | Payer: PRIVATE HEALTH INSURANCE | Source: Ambulatory Visit | Attending: Obstetrics and Gynecology | Admitting: Obstetrics and Gynecology

## 2014-04-13 ENCOUNTER — Encounter (HOSPITAL_COMMUNITY): Payer: Self-pay | Admitting: *Deleted

## 2014-04-13 DIAGNOSIS — D582 Other hemoglobinopathies: Secondary | ICD-10-CM | POA: Diagnosis present

## 2014-04-13 DIAGNOSIS — Z3A29 29 weeks gestation of pregnancy: Secondary | ICD-10-CM | POA: Insufficient documentation

## 2014-04-13 DIAGNOSIS — O4693 Antepartum hemorrhage, unspecified, third trimester: Secondary | ICD-10-CM | POA: Diagnosis not present

## 2014-04-13 DIAGNOSIS — O26853 Spotting complicating pregnancy, third trimester: Secondary | ICD-10-CM | POA: Diagnosis present

## 2014-04-13 DIAGNOSIS — IMO0002 Reserved for concepts with insufficient information to code with codable children: Secondary | ICD-10-CM | POA: Diagnosis present

## 2014-04-13 LAB — URINALYSIS, ROUTINE W REFLEX MICROSCOPIC
BILIRUBIN URINE: NEGATIVE
Glucose, UA: NEGATIVE mg/dL
Ketones, ur: NEGATIVE mg/dL
NITRITE: NEGATIVE
PH: 6.5 (ref 5.0–8.0)
Protein, ur: NEGATIVE mg/dL
SPECIFIC GRAVITY, URINE: 1.025 (ref 1.005–1.030)
Urobilinogen, UA: 1 mg/dL (ref 0.0–1.0)

## 2014-04-13 LAB — URINE MICROSCOPIC-ADD ON

## 2014-04-13 NOTE — MAU Provider Note (Signed)
  History   29 yo G4P1021 at 2329 6/7 weeks presented unannounced c/o browish-red spotting this am, mild cramping.  Denies recent IC.  Reports cramping "no worse' than usual daily cramping.  No hx PTL with previous pregnancy.  Episode of spotting at 24 weeks, cervix friable, with US for cervical length = 3.43 US on 3/31 cervical length 2.39, no funneling, normal growth.  Negative FFN.  Patient Active Problem List   Diagnosis Date Noted  . Marginal insertion of umbilical cord 04/13/2014  . 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 Vaginal bleeding in pregnancy   . HEART MURMUR, BENIGN 10/18/2007    Chief Complaint  Patient presents with  . Vaginal Bleeding   HPI: As above  OB History    Gravida Para Term Preterm AB TAB SAB Ectopic Multiple Living   4 1 1  2 1 1   1       Past Medical History  Diagnosis Date  . Vaginal Pap smear, abnormal   . Heart murmur   . Scoliosis     Past Surgical History  Procedure Laterality Date  . Colposcopy    . Dilation and curettage of uterus      No family history on file.  History  Substance Use Topics  . Smoking status: Never Smoker   . Smokeless tobacco: Never Used  . Alcohol Use: No    Allergies: No Known Allergies  Prescriptions prior to admission  Medication Sig Dispense Refill Last Dose  . Prenatal Vit-Fe Fumarate-FA (PRENATAL MULTIVITAMIN) TABS tablet Take 1 tablet by mouth daily at 12 noon.   04/12/2014 at Unknown time  . ibuprofen (ADVIL,MOTRIN) 600 MG tablet Take 1 tablet (600 mg total) by mouth every 6 (six) hours as needed for fever, headache, mild pain, moderate pain or cramping. 20 tablet 0     ROS:  Brownish-red spotting, mild cramping, +FM Physical Exam   Blood pressure 133/78, pulse 76, temperature 98.3 F (36.8 C), temperature source Oral, resp. rate 18, height 5\' 1"  (1.549 m), weight 190 lb (86.183 kg).    Physical Exam  In NAD Chest clear Heart RRR without murmur Abd gravid, NT Pelvic--moderate amount thin brown  fluid/discharge in vault, appears c/w old blood.  Cervix very posterior, feels closed, thick, vtx. Ext WNL  FHR Category 1 UCs ? Very mild irritability  ED Course  Assessment: IUP at 29 6/7 weeks Vaginal bleeding today--now brown ? Irritability Hx cervical length 2.3 cm 04/06/14.  Plan: GBS, GC/chlamydia Monitoring for uterine activity. Limited US   Ray ChurchLATHAM, Kilyn Maragh CNM, MSN 04/13/2014 11:24 AM  Addendum: Returned from US.  No bleeding noted, single small dark brown stain (approx 1 cm) on pad. US WNL, vtx, normal fluid.  No cervical assessment due to > 29 weeks per US protocol.  FHR Category 1 No UCs, very minimal irritability.  Plan: D/C home on pelvic rest, rest at home tomorrow.  Note for work today and tomorrow. [facility]--will recheck cervical length at that time. To f/u with any active bleeding, UCs, decreased FM, or any other issue.  Nigel BridgemanVicki Macee Venables, CNM 04/13/14 1:50p

## 2014-04-13 NOTE — Discharge Instructions (Signed)
°  Vaginal Bleeding During Pregnancy, Third Trimester °A small amount of bleeding (spotting) from the vagina is relatively common in pregnancy. Various things can cause bleeding or spotting in pregnancy. Sometimes the bleeding is normal and is not a problem. However, bleeding during the third trimester can also be a sign of something serious for the mother and the baby. Be sure to tell your health care provider about any vaginal bleeding right away.  °Some possible causes of vaginal bleeding during the third trimester include:  °· The placenta may be partially or completely covering the opening to the cervix (placenta previa).   °· The placenta may have separated from the uterus (abruption of the placenta).   °· There may be an infection or growth on the cervix.   °· You may be starting labor, called discharging of the mucus plug.   °· The placenta may grow into the muscle layer of the uterus (placenta accreta).   °HOME CARE INSTRUCTIONS  °Watch your condition for any changes. The following actions may help to lessen any discomfort you are feeling:  °· Follow your health care provider's instructions for limiting your activity. If your health care provider orders bed rest, you may need to stay in bed and only get up to use the bathroom. However, your health care provider may allow you to continue light activity. °· If needed, make plans for someone to help with your regular activities and responsibilities while you are on bed rest. °· Keep track of the number of pads you use each day, how often you change pads, and how soaked (saturated) they are. Write this down. °· Do not use tampons. Do not douche. °· Do not have sexual intercourse or orgasms until approved by your health care provider. °· Follow your health care provider's advice about lifting, driving, and physical activities. °· If you pass any tissue from your vagina, save the tissue so you can show it to your health care provider.   °· Only take  over-the-counter or prescription medicines as directed by your health care provider. °· Do not take aspirin because it can make you bleed.   °· Keep all follow-up appointments as directed by your health care provider. °SEEK MEDICAL CARE IF: °· You have any vaginal bleeding during any part of your pregnancy. °· You have cramps or labor pains. °· You have a fever, not controlled by medicine. °SEEK IMMEDIATE MEDICAL CARE IF:  °· You have severe cramps or pain in your back or belly (abdomen). °· You have chills. °· You have a gush of fluid from the vagina. °· You pass large clots or tissue from your vagina. °· Your bleeding increases. °· You feel light-headed or weak. °· You pass out. °· You feel less movement or no movement of the baby.   °MAKE SURE YOU: °· Understand these instructions. °· Will watch your condition. °· Will get help right away if you are not doing well or get worse. °Document Released: 03/15/2002 Document Revised: 12/28/2012 Document Reviewed: 08/30/2012 °ExitCare® Patient Information ©2015 [employer]. This information is not intended to replace advice given to you by your health care provider. Make sure you discuss any questions you have with your health care provider. ° ° °

## 2014-04-13 NOTE — MAU Note (Signed)
Pt reprots she noted some vag bleeding/spotting and mild cramping when she wiped this morning. Good fetal movement reported.

## 2014-04-14 LAB — GC/CHLAMYDIA PROBE AMP (~~LOC~~) NOT AT ARMC
Chlamydia: NEGATIVE
Neisseria Gonorrhea: NEGATIVE

## 2014-04-15 LAB — CULTURE, BETA STREP (GROUP B ONLY)

## 2014-04-24 ENCOUNTER — Encounter (HOSPITAL_COMMUNITY): Payer: Self-pay | Admitting: *Deleted

## 2014-04-24 ENCOUNTER — Inpatient Hospital Stay (HOSPITAL_COMMUNITY): Payer: PRIVATE HEALTH INSURANCE

## 2014-04-24 ENCOUNTER — Inpatient Hospital Stay (HOSPITAL_COMMUNITY)
Admission: AD | Admit: 2014-04-24 | Discharge: 2014-04-26 | DRG: 775 | Disposition: A | Discharge status: Home / Self Care | Payer: PRIVATE HEALTH INSURANCE | Source: Ambulatory Visit | Attending: Obstetrics and Gynecology | Admitting: Obstetrics and Gynecology

## 2014-04-24 DIAGNOSIS — O42013 Preterm premature rupture of membranes, onset of labor within 24 hours of rupture, third trimester: Secondary | ICD-10-CM

## 2014-04-24 DIAGNOSIS — Z3A31 31 weeks gestation of pregnancy: Secondary | ICD-10-CM | POA: Diagnosis present

## 2014-04-24 DIAGNOSIS — Z8759 Personal history of other complications of pregnancy, childbirth and the puerperium: Secondary | ICD-10-CM

## 2014-04-24 DIAGNOSIS — O42913 Preterm premature rupture of membranes, unspecified as to length of time between rupture and onset of labor, third trimester: Secondary | ICD-10-CM | POA: Diagnosis present

## 2014-04-24 DIAGNOSIS — O24429 Gestational diabetes mellitus in childbirth, unspecified control: Secondary | ICD-10-CM | POA: Diagnosis present

## 2014-04-24 DIAGNOSIS — O4290 Premature rupture of membranes, unspecified as to length of time between rupture and onset of labor, unspecified weeks of gestation: Secondary | ICD-10-CM | POA: Diagnosis present

## 2014-04-24 LAB — COMPREHENSIVE METABOLIC PANEL
ALK PHOS: 107 U/L (ref 39–117)
ALT: 27 U/L (ref 0–35)
ANION GAP: 9 (ref 5–15)
AST: 26 U/L (ref 0–37)
Albumin: 3.1 g/dL — ABNORMAL LOW (ref 3.5–5.2)
BUN: 7 mg/dL (ref 6–23)
CALCIUM: 8.9 mg/dL (ref 8.4–10.5)
CO2: 21 mmol/L (ref 19–32)
Chloride: 104 mmol/L (ref 96–112)
Creatinine, Ser: 0.45 mg/dL — ABNORMAL LOW (ref 0.50–1.10)
GFR calc non Af Amer: >90 mL/min (ref >90)
Glucose, Bld: 69 mg/dL — ABNORMAL LOW (ref 70–99)
Potassium: 3.9 mmol/L (ref 3.5–5.1)
Sodium: 134 mmol/L — ABNORMAL LOW (ref 135–145)
TOTAL PROTEIN: 7.2 g/dL (ref 6.0–8.3)
Total Bilirubin: 0.6 mg/dL (ref 0.3–1.2)

## 2014-04-24 LAB — URINALYSIS, ROUTINE W REFLEX MICROSCOPIC
Bilirubin Urine: NEGATIVE
Glucose, UA: NEGATIVE mg/dL
KETONES UR: NEGATIVE mg/dL
Leukocytes, UA: NEGATIVE
Nitrite: NEGATIVE
Protein, ur: NEGATIVE mg/dL
Specific Gravity, Urine: 1.01 (ref 1.005–1.030)
Urobilinogen, UA: 0.2 mg/dL (ref 0.0–1.0)
pH: 6.5 (ref 5.0–8.0)

## 2014-04-24 LAB — CBC WITH DIFFERENTIAL/PLATELET
Basophils Absolute: 0 10*3/uL (ref 0.0–0.1)
Basophils Relative: 0 % (ref 0–1)
Eosinophils Absolute: 0.2 10*3/uL (ref 0.0–0.7)
Eosinophils Relative: 2 % (ref 0–5)
HCT: 32.1 % — ABNORMAL LOW (ref 36.0–46.0)
Hemoglobin: 11.4 g/dL — ABNORMAL LOW (ref 12.0–15.0)
Lymphocytes Relative: 21 % (ref 12–46)
Lymphs Abs: 2.4 10*3/uL (ref 0.7–4.0)
MCH: 27 pg (ref 26.0–34.0)
MCHC: 35.5 g/dL (ref 30.0–36.0)
MCV: 76.1 fL — ABNORMAL LOW (ref 78.0–100.0)
Monocytes Absolute: 1.3 10*3/uL — ABNORMAL HIGH (ref 0.1–1.0)
Monocytes Relative: 11 % (ref 3–12)
Neutro Abs: 7.5 10*3/uL (ref 1.7–7.7)
Neutrophils Relative %: 66 % (ref 43–77)
Platelets: 252 10*3/uL (ref 150–400)
RBC: 4.22 MIL/uL (ref 3.87–5.11)
RDW: 14.4 % (ref 11.5–15.5)
WBC: 11.4 10*3/uL — ABNORMAL HIGH (ref 4.0–10.5)

## 2014-04-24 LAB — LACTATE DEHYDROGENASE: LDH: 151 U/L (ref 94–250)

## 2014-04-24 LAB — URINE MICROSCOPIC-ADD ON

## 2014-04-24 LAB — URIC ACID: Uric Acid, Serum: 3.6 mg/dL (ref 2.4–7.0)

## 2014-04-24 LAB — AMNISURE RUPTURE OF MEMBRANE (ROM) NOT AT ARMC: Amnisure ROM: POSITIVE

## 2014-04-24 LAB — TYPE AND SCREEN
ABO/RH(D): A POS
Antibody Screen: NEGATIVE

## 2014-04-24 LAB — GROUP B STREP BY PCR: Group B strep by PCR: NEGATIVE

## 2014-04-24 MED ORDER — MORPHINE SULFATE 4 MG/ML IJ SOLN
4.0000 mg | Freq: Once | INTRAMUSCULAR | Status: AC
  Administered 2014-04-24: 4 mg via INTRAVENOUS

## 2014-04-24 MED ORDER — SODIUM CHLORIDE 0.9 % IV SOLN
2.0000 g | Freq: Four times a day (QID) | INTRAVENOUS | Status: DC
Start: 2014-04-24 — End: 2014-04-25
  Administered 2014-04-24 (×2): 2 g via INTRAVENOUS
  Filled 2014-04-24 (×5): qty 2000

## 2014-04-24 MED ORDER — LACTATED RINGERS IV SOLN: 500.0000 mL | INTRAVENOUS | Status: DC | PRN

## 2014-04-24 MED ORDER — TERBUTALINE SULFATE 1 MG/ML IJ SOLN: 0.2500 mg | Freq: Once | INTRAMUSCULAR | Status: DC

## 2014-04-24 MED ORDER — CITRIC ACID-SODIUM CITRATE 334-500 MG/5ML PO SOLN: 30.0000 mL | ORAL | Status: DC | PRN

## 2014-04-24 MED ORDER — TERBUTALINE SULFATE 1 MG/ML IJ SOLN
INTRAMUSCULAR | Status: AC
  Filled 2014-04-24: qty 1

## 2014-04-24 MED ORDER — OXYCODONE-ACETAMINOPHEN 5-325 MG PO TABS: 1.0000 | ORAL_TABLET | ORAL | Status: DC | PRN

## 2014-04-24 MED ORDER — LIDOCAINE HCL (PF) 1 % IJ SOLN
INTRAMUSCULAR | Status: AC
  Filled 2014-04-24: qty 30

## 2014-04-24 MED ORDER — ACETAMINOPHEN 325 MG PO TABS: 650.0000 mg | ORAL_TABLET | ORAL | Status: DC | PRN

## 2014-04-24 MED ORDER — ONDANSETRON HCL 4 MG/2ML IJ SOLN: 4.0000 mg | Freq: Four times a day (QID) | INTRAMUSCULAR | Status: DC | PRN

## 2014-04-24 MED ORDER — NIFEDIPINE 10 MG PO CAPS
10.0000 mg | ORAL_CAPSULE | ORAL | Status: DC | PRN
  Administered 2014-04-24 (×2): 10 mg via ORAL
  Filled 2014-04-24 (×3): qty 1

## 2014-04-24 MED ORDER — LACTATED RINGERS IV BOLUS (SEPSIS)
500.0000 mL | Freq: Once | INTRAVENOUS | Status: AC
  Administered 2014-04-24: 500 mL via INTRAVENOUS

## 2014-04-24 MED ORDER — OXYTOCIN 40 UNITS IN LACTATED RINGERS INFUSION - SIMPLE MED: 62.5000 mL/h | INTRAVENOUS | Status: DC

## 2014-04-24 MED ORDER — MORPHINE SULFATE 4 MG/ML IJ SOLN
INTRAMUSCULAR | Status: AC
  Filled 2014-04-24: qty 1

## 2014-04-24 MED ORDER — ZOLPIDEM TARTRATE 5 MG PO TABS: 5.0000 mg | ORAL_TABLET | Freq: Every evening | ORAL | Status: DC | PRN

## 2014-04-24 MED ORDER — LACTATED RINGERS IV SOLN: INTRAVENOUS | Status: DC

## 2014-04-24 MED ORDER — AMOXICILLIN 500 MG PO CAPS: 500.0000 mg | ORAL_CAPSULE | Freq: Three times a day (TID) | ORAL | Status: DC

## 2014-04-24 MED ORDER — DOCUSATE SODIUM 100 MG PO CAPS
100.0000 mg | ORAL_CAPSULE | Freq: Every day | ORAL | Status: DC
  Filled 2014-04-24 (×2): qty 1

## 2014-04-24 MED ORDER — LACTATED RINGERS IV SOLN
INTRAVENOUS | Status: DC
  Administered 2014-04-24 (×2): via INTRAVENOUS

## 2014-04-24 MED ORDER — CALCIUM CARBONATE ANTACID 500 MG PO CHEW
2.0000 | CHEWABLE_TABLET | ORAL | Status: DC | PRN
  Filled 2014-04-24: qty 2

## 2014-04-24 MED ORDER — BETAMETHASONE SOD PHOS & ACET 6 (3-3) MG/ML IJ SUSP
12.0000 mg | INTRAMUSCULAR | Status: DC
  Administered 2014-04-24: 12 mg via INTRAMUSCULAR
  Filled 2014-04-24 (×2): qty 2

## 2014-04-24 MED ORDER — OXYTOCIN 40 UNITS IN LACTATED RINGERS INFUSION - SIMPLE MED
INTRAVENOUS | Status: AC
  Filled 2014-04-24: qty 1000

## 2014-04-24 MED ORDER — AZITHROMYCIN 250 MG PO TABS
500.0000 mg | ORAL_TABLET | Freq: Once | ORAL | Status: AC
  Administered 2014-04-24: 500 mg via ORAL
  Filled 2014-04-24: qty 2

## 2014-04-24 MED ORDER — OXYCODONE-ACETAMINOPHEN 5-325 MG PO TABS
2.0000 | ORAL_TABLET | ORAL | Status: DC | PRN
Start: 2014-04-24 — End: 2014-04-25

## 2014-04-24 MED ORDER — PRENATAL MULTIVITAMIN CH
1.0000 | ORAL_TABLET | Freq: Every day | ORAL | Status: DC
  Filled 2014-04-24 (×2): qty 1

## 2014-04-24 MED ORDER — OXYTOCIN BOLUS FROM INFUSION
500.0000 mL | INTRAVENOUS | Status: DC
  Administered 2014-04-24: 500 mL via INTRAVENOUS

## 2014-04-24 MED ORDER — LIDOCAINE HCL (PF) 1 % IJ SOLN
30.0000 mL | INTRAMUSCULAR | Status: DC | PRN
  Filled 2014-04-24: qty 30

## 2014-04-24 MED ORDER — CARBOPROST TROMETHAMINE 250 MCG/ML IM SOLN
250.0000 ug | Freq: Once | INTRAMUSCULAR | Status: AC
  Administered 2014-04-24: 250 ug via INTRAMUSCULAR

## 2014-04-24 NOTE — Progress Notes (Addendum)
Hospital day # 0 pregnancy at 7650w3d--PROM today.  S:  Now on Antenatal.      Perception of contractions: Mild, irregular cramping.  Reports now more frequent than before.  In NAD.      Vaginal bleeding: None       Vaginal discharge:  watery  O: BP 122/69 mmHg  Pulse 82  Temp(Src) 98.1 F (36.7 C) (Oral)  Resp 18  Ht 5\' 1"  (1.549 m)  Wt 192 lb (87.091 kg)  BMI 36.30 kg/m2      Fetal tracings: Category 1       Contractions:   Occasional mild irritability, sporadic more defined UCs, still mild, q 6-12 min.      Uterus gravid and non-tender      Extremities: no significant edema and no signs of DVT          Labs:   Results for orders placed or performed during the hospital encounter of 04/24/14 (from the past 24 hour(s))  Urinalysis, Routine w reflex microscopic     Status: Abnormal   Collection Time: 04/24/14 11:30 AM  Result Value Ref Range   Color, Urine YELLOW YELLOW   APPearance CLEAR CLEAR   Specific Gravity, Urine 1.010 1.005 - 1.030   pH 6.5 5.0 - 8.0   Glucose, UA NEGATIVE NEGATIVE mg/dL   Hgb urine dipstick TRACE (A) NEGATIVE   Bilirubin Urine NEGATIVE NEGATIVE   Ketones, ur NEGATIVE NEGATIVE mg/dL   Protein, ur NEGATIVE NEGATIVE mg/dL   Urobilinogen, UA 0.2 0.0 - 1.0 mg/dL   Nitrite NEGATIVE NEGATIVE   Leukocytes, UA NEGATIVE NEGATIVE  Urine microscopic-add on     Status: Abnormal   Collection Time: 04/24/14 11:30 AM  Result Value Ref Range   Squamous Epithelial / LPF FEW (A) RARE   RBC / HPF 0-2 <3 RBC/hpf   Bacteria, UA RARE RARE  Amnisure rupture of membrane (rom)     Status: None   Collection Time: 04/24/14 11:44 AM  Result Value Ref Range   Amnisure ROM POSITIVE   Group B strep by PCR     Status: None   Collection Time: 04/24/14 11:44 AM  Result Value Ref Range   Group B strep by PCR NEGATIVE NEGATIVE  CBC with Differential     Status: Abnormal   Collection Time: 04/24/14  1:10 PM  Result Value Ref Range   WBC 11.4 (H) 4.0 - 10.5 K/uL   RBC 4.22 3.87  - 5.11 MIL/uL   Hemoglobin 11.4 (L) 12.0 - 15.0 g/dL   HCT 40.932.1 (L) 81.136.0 - 91.446.0 %   MCV 76.1 (L) 78.0 - 100.0 fL   MCH 27.0 26.0 - 34.0 pg   MCHC 35.5 30.0 - 36.0 g/dL   RDW 78.214.4 95.611.5 - 21.315.5 %   Platelets 252 150 - 400 K/uL   Neutrophils Relative % 66 43 - 77 %   Neutro Abs 7.5 1.7 - 7.7 K/uL   Lymphocytes Relative 21 12 - 46 %   Lymphs Abs 2.4 0.7 - 4.0 K/uL   Monocytes Relative 11 3 - 12 %   Monocytes Absolute 1.3 (H) 0.1 - 1.0 K/uL   Eosinophils Relative 2 0 - 5 %   Eosinophils Absolute 0.2 0.0 - 0.7 K/uL   Basophils Relative 0 0 - 1 %   Basophils Absolute 0.0 0.0 - 0.1 K/uL  Comprehensive metabolic panel     Status: Abnormal   Collection Time: 04/24/14  1:10 PM  Result Value Ref Range  Sodium 134 (L) 135 - 145 mmol/L   Potassium 3.9 3.5 - 5.1 mmol/L   Chloride 104 96 - 112 mmol/L   CO2 21 19 - 32 mmol/L   Glucose, Bld 69 (L) 70 - 99 mg/dL   BUN 7 6 - 23 mg/dL   Creatinine, Ser 1.61 (L) 0.50 - 1.10 mg/dL   Calcium 8.9 8.4 - 09.6 mg/dL   Total Protein 7.2 6.0 - 8.3 g/dL   Albumin 3.1 (L) 3.5 - 5.2 g/dL   AST 26 0 - 37 U/L   ALT 27 0 - 35 U/L   Alkaline Phosphatase 107 39 - 117 U/L   Total Bilirubin 0.6 0.3 - 1.2 mg/dL   GFR calc non Af Amer >90 >90 mL/min   GFR calc Af Amer >90 >90 mL/min   Anion gap 9 5 - 15  Lactate dehydrogenase     Status: None   Collection Time: 04/24/14  1:10 PM  Result Value Ref Range   LDH 151 94 - 250 U/L  Uric acid     Status: None   Collection Time: 04/24/14  1:10 PM  Result Value Ref Range   Uric Acid, Serum 3.6 2.4 - 7.0 mg/dL  Type and screen     Status: None   Collection Time: 04/24/14  1:10 PM  Result Value Ref Range   ABO/RH(D) A POS    Antibody Screen NEG    Sample Expiration 04/27/2014           Meds:  . ampicillin (OMNIPEN) IV  2 g Intravenous Q6H   Followed by  . [START ON 04/26/2014] amoxicillin  500 mg Oral Q8H  . azithromycin  500 mg Oral Once  . betamethasone acetate-betamethasone sodium phosphate  12 mg  Intramuscular Q24H  . docusate sodium  100 mg Oral Daily  . prenatal multivitamin  1 tablet Oral Q1200  Received single Azithromycin po dose and 1st dose IV Amoxicillin Received 1st dose betamethasone 1321  [place]:  Vtx, EFW 3+12, 49%ile, marginal insertion, AFI 3.99, anterior placenta.  MFM recommendation:  ATB for latency, delivery at 34 weeks.  A: [redacted]w[redacted]d with PROM     Stable  P: Continue current plan of care      Upcoming tests/treatments:  T&S q 72 hours, continuous EFM at present      MDs will follow  Nigel Bridgeman CNM, [place] 04/24/2014 4:24 PM

## 2014-04-24 NOTE — Progress Notes (Signed)
Subjective: Nurse call reporting patient c/o cramping, blood when wiping, and change in FHR baseline. Strip and Chart Reviewed.  In room to assess.  Patient breathing through contractions upon provider arrival.  Patient reports "cramping" in lower abdominal area and vaginal pressure when cramping occurs.  Patient states bloody mucous noted while wiping, but nothing noted on peri-pad.    Objective:  Filed Vitals:   04/24/14 1133 04/24/14 1555  BP: 130/83 122/69  Pulse: 87 82  Temp: 98.3 F (36.8 C) 98.1 F (36.7 C)  TempSrc:  Oral  Resp: 16 18  Height: 5\' 1"  (1.549 m)   Weight: 192 lb (87.091 kg)    Physical Exam SVE: Closed/70/-3 Bloody Show Noted  FHR: 110 bpm, Mod Var, -Decels, -Accels UC: One graphed, two palpated mild to moderate  Assessment: IUP at 4131w3dwks Cat I FT pPROM Contractions  Plan: Discussed VE findings Reassurances given regarding bloody show Fluid bolus-5300mL once Procardia per [facility] protocol Will reassess if symptoms not resolved with interventions Continue to monitor  Sabas SousJ. Zaeem Kandel, CNM 04/24/2014 8:55 PM

## 2014-04-24 NOTE — MAU Note (Signed)
Urine in lab 

## 2014-04-24 NOTE — MAU Note (Signed)
Leaking amniotic fluid since early morning, clear, changed 4 pads, some yellow discharge, having some lower abdominal cramping which is not new.

## 2014-04-24 NOTE — H&P (Signed)
Katie Rosales is a 29 y.o. female, [id number] at 46 3/7 weeks, presenting with leaking of fluid since around 11pm--initially very small amount, then had pronounced "gushes" when she arrived at work today.  Denies bleeding, reports sporadic mild cramping, "but the same as it's been for several weeks".  Reports +FM.  Patient Active Problem List   Diagnosis Date Noted  . PROM (premature rupture of membranes) at 31 3/7 weeks 04/24/2014  . History of gestational hypertension 04/24/2014  . Marginal insertion of umbilical cord 04/13/2014  . 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   EDC of 06/23/14 was established by LMP, and in agreement with [place] at NOB visit.  ? Uterine septum noted in certain views.  Cercix 3.78 Anatomy scan:  19 5/7 weeks, with normal findings and an anterior right lateral placenta, with no uterine septum noted. Female.  Cervix 3.26. Additional [place] evaluations:   24 weeks--in MAU due to vaginal bleeding.  Normal fluid, cervix not assessed.  Marginal cord insertion noted. 28 4/7 weeks--EFW 2+13, 34%ile, AFI 14, 45%ile, cervix 2.39, no funneling. Stable marginal cord insertion noted. Significant prenatal events:   Treated for UTI in early pregnancy with Macrobid.  Diarrhea and vomiting at 18 4/7 weeks, likely due to food exposure.  Possible uterine septum on early [place], but no evidence on later [place].  Marginal insertion dx on [place] at [facility] at 24 weeks.  Seen at office at 24 weeks for cramping and spotting--sent to MAU, with US showing marginal insertion.  FFN negative.  Seen at MAU at 29 6/7 weeks for spotting.  US WNL.  Cervical length 2.39 at 28 4/7 weeks, 2.75 at 30 6/7 weeks. Last evaluation:  04/20/14--cervical length stable at 2.75, BP 100/70.  OB History    Gravida Para Term Preterm AB TAB SAB Ectopic Multiple Living   2005--TAB, 5 weeks 2010--SVB, 38 5/7 weeks, 2 hour labor, 5+14, epidural, induced due to  gestational hypertension 2014--SAB, 8 weeks  Past Medical History  Diagnosis Date  . Vaginal Pap smear, abnormal   . Heart murmur   . Scoliosis    Past Surgical History  Procedure Laterality Date  . Colposcopy    . Dilation and curettage of uterus     Family History: Mother HTN; MGM aneurysm; MA aneurysm; PGM dementia; PGF MI; PA MI.   Social History:  reports that she has never smoked. She has never used smokeless tobacco. She reports that she does not drink alcohol or use illicit drugs.  Patient is African Naval architect, of the [place] faith, high-school educated, employed as Environmental health practitioner.  FOB, Mercie Eon, is not current present with her today.   Prenatal Transfer Tool  Maternal Diabetes: No Genetic Screening: Declined Maternal Ultrasounds/Referrals: Normal Fetal Ultrasounds or other Referrals:  None Maternal Substance Abuse:  No Significant Maternal Medications:  None Significant Maternal Lab Results: None--GBS negative 04/10/14, pending on recheck today.  TDAP NA Flu NA  ROS:  Leaking clear fluid, +FM  No Known Allergies     Blood pressure 130/83, pulse 87, temperature 98.3 F (36.8 C), resp. rate 16, height  (1.549 m), weight 192 lb (87.091 kg).    Chest clear Heart RRR without murmur Abd gravid, NT, FH 32 cm Pelvic: Deferred Ext: WNL  FHR: Reassuring, no decels UCs:  Very sporadic, mild.  Prenatal labs: ABO, Rh: --/--/A POS (04/18  1310) Antibody: NEG (04/18 1310) Rubella:   Immune RPR: Nonreactive (11/30 0000)  HBsAg: Negative (11/30 0000)  HIV: Non-reactive (11/30 0000)  GBS:  Negative 04/10/14, pending from today Sickle cell/Hgb electrophoresis:  Hgb C--unknown FOB status Pap:  WNL 01/11/14, BV GC:  Negative 12/05/13, 03/07/14, and 04/13/14--repeated today Chlamydia:  Negative 12/05/13, 03/07/14, and 04/13/14--repeated today Genetic screenings:  Declined Glucola:  WNL Other:   Hgb 12.2 at NOB, 11.728 weeks FFN negative 03/07/14     Assessment/Plan: IUP at 31 3/7 weeks PROM  Plan: Admit to [facility] per consult with Dr. Normand Sloopillard Antibiotics for latency Betamethasone course US for growth, fluid CBC, diff, PIH labs T&S q 72 hours Reviewed plan of care for PROM, with possibility of prolonged hospitalization, induction at 34 weeks if no spontaneous labor by then, risk of infection, fetal distress, need for cesarean birth.  Patient and her mother seem to understand these risks and are agreeable with the plan.  Nyra CapesLATHAM, VICKICNM, [place] 04/24/2014, 3:06 PM

## 2014-04-25 ENCOUNTER — Encounter (HOSPITAL_COMMUNITY): Payer: Self-pay | Admitting: *Deleted

## 2014-04-25 LAB — RPR: RPR: NONREACTIVE

## 2014-04-25 LAB — CBC
HEMATOCRIT: 29.4 % — AB (ref 36.0–46.0)
Hemoglobin: 10.4 g/dL — ABNORMAL LOW (ref 12.0–15.0)
MCH: 26.8 pg (ref 26.0–34.0)
MCHC: 35.4 g/dL (ref 30.0–36.0)
MCV: 75.8 fL — AB (ref 78.0–100.0)
Platelets: 223 10*3/uL (ref 150–400)
RBC: 3.88 MIL/uL (ref 3.87–5.11)
RDW: 14.3 % (ref 11.5–15.5)
WBC: 26.6 10*3/uL — AB (ref 4.0–10.5)

## 2014-04-25 LAB — HIV ANTIBODY (ROUTINE TESTING W REFLEX): HIV Screen 4th Generation wRfx: NONREACTIVE

## 2014-04-25 LAB — CULTURE, OB URINE
Colony Count: NO GROWTH
Culture: NO GROWTH

## 2014-04-25 MED ORDER — SIMETHICONE 80 MG PO CHEW: 80.0000 mg | CHEWABLE_TABLET | ORAL | Status: DC | PRN

## 2014-04-25 MED ORDER — OXYCODONE-ACETAMINOPHEN 5-325 MG PO TABS: 2.0000 | ORAL_TABLET | ORAL | Status: DC | PRN

## 2014-04-25 MED ORDER — WITCH HAZEL-GLYCERIN EX PADS: 1.0000 "application " | MEDICATED_PAD | CUTANEOUS | Status: DC | PRN

## 2014-04-25 MED ORDER — ZOLPIDEM TARTRATE 5 MG PO TABS: 5.0000 mg | ORAL_TABLET | Freq: Every evening | ORAL | Status: DC | PRN

## 2014-04-25 MED ORDER — MORPHINE SULFATE 4 MG/ML IJ SOLN: 4.0000 mg | Freq: Once | INTRAMUSCULAR | Status: DC

## 2014-04-25 MED ORDER — SENNOSIDES-DOCUSATE SODIUM 8.6-50 MG PO TABS
2.0000 | ORAL_TABLET | ORAL | Status: DC
  Administered 2014-04-25: 2 via ORAL
  Filled 2014-04-25: qty 2

## 2014-04-25 MED ORDER — OXYCODONE-ACETAMINOPHEN 5-325 MG PO TABS: 1.0000 | ORAL_TABLET | ORAL | Status: DC | PRN

## 2014-04-25 MED ORDER — TETANUS-DIPHTH-ACELL PERTUSSIS 5-2.5-18.5 LF-MCG/0.5 IM SUSP
0.5000 mL | Freq: Once | INTRAMUSCULAR | Status: AC
  Administered 2014-04-26: 0.5 mL via INTRAMUSCULAR

## 2014-04-25 MED ORDER — ONDANSETRON HCL 4 MG/2ML IJ SOLN: 4.0000 mg | INTRAMUSCULAR | Status: DC | PRN

## 2014-04-25 MED ORDER — IBUPROFEN 600 MG PO TABS
600.0000 mg | ORAL_TABLET | Freq: Four times a day (QID) | ORAL | Status: DC
  Administered 2014-04-25 – 2014-04-26 (×6): 600 mg via ORAL
  Filled 2014-04-25 (×6): qty 1

## 2014-04-25 MED ORDER — DIBUCAINE 1 % RE OINT: 1.0000 "application " | TOPICAL_OINTMENT | RECTAL | Status: DC | PRN

## 2014-04-25 MED ORDER — PRENATAL MULTIVITAMIN CH
1.0000 | ORAL_TABLET | Freq: Every day | ORAL | Status: DC
  Administered 2014-04-25 – 2014-04-26 (×2): 1 via ORAL
  Filled 2014-04-25 (×2): qty 1

## 2014-04-25 MED ORDER — FERROUS SULFATE 325 (65 FE) MG PO TABS
325.0000 mg | ORAL_TABLET | Freq: Two times a day (BID) | ORAL | Status: DC
  Administered 2014-04-25 – 2014-04-26 (×2): 325 mg via ORAL
  Filled 2014-04-25 (×2): qty 1

## 2014-04-25 MED ORDER — BENZOCAINE-MENTHOL 20-0.5 % EX AERO: 1.0000 "application " | INHALATION_SPRAY | CUTANEOUS | Status: DC | PRN

## 2014-04-25 MED ORDER — ONDANSETRON HCL 4 MG PO TABS: 4.0000 mg | ORAL_TABLET | ORAL | Status: DC | PRN

## 2014-04-25 MED ORDER — ACETAMINOPHEN 325 MG PO TABS: 650.0000 mg | ORAL_TABLET | ORAL | Status: DC | PRN

## 2014-04-25 MED ORDER — DIPHENHYDRAMINE HCL 25 MG PO CAPS: 25.0000 mg | ORAL_CAPSULE | Freq: Four times a day (QID) | ORAL | Status: DC | PRN

## 2014-04-25 MED ORDER — LANOLIN HYDROUS EX OINT: TOPICAL_OINTMENT | CUTANEOUS | Status: DC | PRN

## 2014-04-25 NOTE — Progress Notes (Signed)
Katie Rosales   Subjective: Post Partum Day 1 Vaginal delivery, no laceration Preterm at 31.3 infant in NICU Patient up ad lib, denies syncope or dizziness. Reports consuming regular diet without issues and denies N/V No issues with urination and reports bleeding is appropriate  Feeding:  bottle Contraceptive plan:   unsure  Objective: Temp:  [98 F (36.7 C)-98.8 F (37.1 C)] 98.4 F (36.9 C) (04/19 0610) Pulse Rate:  [73-92] 85 (04/19 0610) Resp:  [16-20] 18 (04/19 0610) BP: (104-130)/(51-83) 104/63 mmHg (04/19 0610) SpO2:  [98 %-100 %] 98 % (04/19 0610) Weight:  [192 lb (87.091 kg)] 192 lb (87.091 kg) (04/18 1133)  Physical Exam:  General: alert and cooperative Ext: WNL, no edema. No evidence of DVT seen on physical exam. Breast: Soft filling Lungs: CTAB Heart RRR without murmur Abdomen:  Soft, fundus firm, lochia scant, + bowel sounds, non distended, non tender Lochia: appropriate Uterine Fundus: firm Laceration: n/a    Recent Labs  04/24/14 1310 04/25/14 0530  HGB 11.4* 10.4*  HCT 32.1* 29.4*    Assessment S/P Vaginal Delivery-Day 1 Stable  Normal Involution Breast pumping Circumcision:  Plan: Continue current care Plan for discharge tomorrow  Iron supplement    Nguyen Butler, CNM, MSN 04/25/2014, 8:19 AM

## 2014-04-25 NOTE — Lactation Note (Addendum)
This note was copied from the chart of Katie Rosales. Lactation Consultation Note  Patient Name: Katie Gwen HerLatoya Soots AVWUJ'WToday's Date: 04/25/2014 Reason for consult: Initial assessment;NICU baby NICU baby 13 hours of life. Mom states that she had a good milk supply with 29-year-old. Baby was a difficult latch, so mom pumped. Set mom up with DEBP and enc mom to pump every 3 hours for 15 minutes. Mom has small bottles, labels, and stickers with instructions. Mom aware of NICU pumping rooms and ENC to pump after visiting and/or providing Kangaroo care/STS. Discussed transporting EBM from home to NICU. Mom given NICU booklet with review, aware of OP/BFSG and LC phone line assistance after D/C. Enc mom to ask for assistance with pumping as needed.   Maternal Data Has patient been taught Hand Expression?: Yes (Per mom.) Does the patient have breastfeeding experience prior to this delivery?: Yes  Feeding    LATCH Score/Interventions                      Lactation Tools Discussed/Used     Consult Status Consult Status: Follow-up Date: 04/26/14 Follow-up type: In-patient    Geralynn OchsWILLIARD, Evalyne Cortopassi 04/25/2014, 12:18 PM

## 2014-04-25 NOTE — Plan of Care (Signed)
Problem: Consults Goal: Birthing Suites Patient Information Press F2 to bring up selections list Outcome: Completed/Met Date Met:  04/25/14  Pt < [redacted] weeks EGA and Antenatal Patient (< 37 weeks)  Problem: Phase II Progression Outcomes Goal: Initial bonding Outcome: Not Applicable Date Met:  61/53/79 Baby to nicu  Problem: Discharge Progression Outcomes Goal: Transfer to Post Partum room/infant to nursery Outcome: Completed/Met Date Met:  04/25/14 To third floor

## 2014-04-26 LAB — CBC
HEMATOCRIT: 28 % — AB (ref 36.0–46.0)
HEMOGLOBIN: 10 g/dL — AB (ref 12.0–15.0)
MCH: 27.2 pg (ref 26.0–34.0)
MCHC: 35.7 g/dL (ref 30.0–36.0)
MCV: 76.3 fL — ABNORMAL LOW (ref 78.0–100.0)
Platelets: 257 10*3/uL (ref 150–400)
RBC: 3.67 MIL/uL — ABNORMAL LOW (ref 3.87–5.11)
RDW: 14.4 % (ref 11.5–15.5)
WBC: 17.2 10*3/uL — AB (ref 4.0–10.5)

## 2014-04-26 NOTE — Discharge Summary (Signed)
Vaginal Delivery Discharge Summary  Katie Rosales  DOB:    1985/02/17 MRN:    [id number] CSN:    [id number]  Date of admission:                  04/24/14  Date of discharge:                   04/26/14  Procedures this admission:   SVB, betamethasone course, IV antibiotics for latency  Date of Delivery: 04/24/14  Newborn Data:  Live born female  Birth Weight: 3 lb 13.7 oz (1750 g) APGAR: 6, 7  Baby remains in NICU at time of mother's d/c Name: Katie Rosales Circumcision Plan: Prior to d/c from NICU  History of Present Illness:  Ms. Katie Rosales is a 29 y.o. female, [id number], who presents at [redacted]w[redacted]d weeks gestation. The patient has been followed at [facility]. She was admitted rupture of membranes at 31 3/7 weeks. Her pregnancy has been complicated by:  Patient Active Problem List   Diagnosis Date Noted  . Preterm delivery 04/25/2014  . PROM (premature rupture of membranes) at 31 3/7 weeks 04/24/2014  . History of gestational hypertension 04/24/2014  . Marginal insertion of umbilical cord 04/13/2014  . 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  Negative GBS. Received 1 dose of betamethasone and day 1 of antibiotics for latency.  Progressed spontaneously during the late evening, with rapid labor. Utilized nothing for pain management.  Delivery was performed by Gerrit Heck, CNM, without complication.  Placental delivery was delayed for approx 1 hour--patient received IV pitocin infusion and Hemabate IM x 1, after which placenta delivered spontaneously, with minimal bleeding subsequently.  Baby was taken to NICU in guarded condition.  Small vaginal abrasions noted, none requiring sutures.  Mother had an uncomplicated postpartum course, with patient pumping for breastmilk.  Baby remains in NICU at the time of mother's d/c, recently  extubated, now on CPAP.  Mom's physical exam was WNL, and she was discharged home in stable condition. Contraception plan was undecided at the time of d/c.  She received adequate benefit from po pain medications, and declined any pain med Rxs at d/c.  Hgb on day 1 was 10.4, with WBC ct 26.6.  Day 2 Hgb 10, WBC 17.2.   Feeding:  breast  Contraception:  Undecided  Hemoglobin Results:  CBC Latest Ref Rng 04/26/2014 04/25/2014 04/24/2014  WBC 4.0 - 10.5 K/uL 17.2(H) 26.6(H) 11.4(H)  Hemoglobin 12.0 - 15.0 g/dL 10.0(L) 10.4(L) 11.4(L)  Hematocrit 36.0 - 46.0 % 28.0(L) 29.4(L) 32.1(L)  Platelets 150 - 400 K/uL 257 223 252     Discharge Physical Exam:   General: alert Lochia: appropriate Uterine Fundus: firm Incision: Intact DVT Evaluation: No evidence of DVT seen on physical exam. Negative Homan's sign.  Intrapartum Procedures: spontaneous vaginal delivery Postpartum Procedures: none Complications-Operative and Postpartum: None  Discharge Diagnoses: PTD after PROM  Discharge Information:  Activity:           pelvic rest Diet:                routine Medications: OTC Ibuprophen or Tylenol Condition:      stable Instructions:     Discharge to: home  Follow-up Information    Follow up with [facility]. Schedule an appointment as soon as possible for a visit in 6  weeks.   Specialty:  Obstetrics and Gynecology   Why:  Call for any questions or concerns.   Contact information:   [address] [place] [phone]       Nigel BridgemanLATHAM, Kalid Ghan [facility] CenterCNM 04/26/2014 12:33 PM

## 2014-04-26 NOTE — Progress Notes (Signed)
Discharge teaching complete. Pt understood all instructions and did not have any questions. Pt ambulated out of the hospital and discharged home to family.  

## 2014-04-26 NOTE — Lactation Note (Signed)
This note was copied from the chart of Katie Rosales Szwed. Lactation Consultation Note  Follow up prior to discharge.  Mom is not pumping on a regular basis and states she plans to start when she gets home.  Stressed importance of pumping every 3 hours to establish and maintain milk supply.  Mom rented a pump for 2 weeks and is checking on insurance benefits.  Encouraged to call with concerns/assist prn.  Patient Name: Katie Rosales Bunte GEXBM'WToday's Date: 04/26/2014     Maternal Data    Feeding    LATCH Score/Interventions                      Lactation Tools Discussed/Used     Consult Status      Huston FoleyMOULDEN, Lotus Gover S 04/26/2014, 1:51 PM

## 2014-04-26 NOTE — Discharge Instructions (Signed)

## 2014-04-27 ENCOUNTER — Ambulatory Visit: Payer: Self-pay

## 2014-04-27 NOTE — Lactation Note (Signed)
This note was copied from the chart of Katie Rosales Anastasia. Lactation Consultation Note  Follow up visit made in NICU.  Mom states pumping is going well and her milk is coming in.  No questions/concerns at this time.  Encouraged to call for concerns/assist prn.  Patient Name: Katie Rosales Rys ZOXWR'UToday's Date: 04/27/2014     Maternal Data    Feeding Feeding Type: Breast Milk with Formula added Length of feed: 20 min  LATCH Score/Interventions                      Lactation Tools Discussed/Used     Consult Status      Huston FoleyMOULDEN, Swetha Rayle S 04/27/2014, 3:28 PM

## 2014-04-28 ENCOUNTER — Encounter (HOSPITAL_COMMUNITY): Payer: Self-pay | Admitting: *Deleted

## 2014-05-08 ENCOUNTER — Ambulatory Visit: Payer: Self-pay

## 2014-05-08 NOTE — Lactation Note (Signed)
This note was copied from the chart of Katie Rosales. Lactation Consultation Note  Mom is concerned about her milk supply.  She states she was filling up 60 ml bottles but now pumping less than 30 mls from each breast.  Mom did not initiate pumping until after discharge. Mom states she pumps every 3 hours during the day and 7-8 hours at night.  Instructed to pump every 2 hours during the day when possible and shown how to use hands on pumping.  Recommended she set alarm to pump in 6 hours at night.  Discussed relaxing during pumping.  Encouraged mom to notify us if no increase in production by the end of the week. Patient Name: Katie Gwen HerLatoya Urbanski XWRUE'AToday's Date: 05/08/2014     Maternal Data    Feeding Feeding Type: Breast Milk Length of feed: 30 min  LATCH Score/Interventions                      Lactation Tools Discussed/Used     Consult Status      Huston FoleyMOULDEN, Drue Camera S 05/08/2014, 4:13 PM

## 2014-05-10 ENCOUNTER — Ambulatory Visit: Payer: Self-pay

## 2014-05-10 NOTE — Lactation Note (Signed)
This note was copied from the chart of [place]. Lactation Consultation Note  Met with mom in the NICU.  She is currently doing kangaroo care with baby.  Mom reports increasing her pumping and beginning to see an increase in milk supply.  Patient Name: Katie Rosales SXQKS'K Date: 05/10/2014     Maternal Data    Feeding    LATCH Score/Interventions                      Lactation Tools Discussed/Used     Consult Status      Ave Filter 05/10/2014, 3:40 PM

## 2014-05-11 ENCOUNTER — Encounter (HOSPITAL_COMMUNITY)
Admission: RE | Admit: 2014-05-11 | Discharge: 2014-05-11 | Disposition: A | Discharge status: Home / Self Care | Payer: PRIVATE HEALTH INSURANCE | Source: Ambulatory Visit | Attending: Obstetrics and Gynecology | Admitting: Obstetrics and Gynecology

## 2014-05-12 ENCOUNTER — Ambulatory Visit: Payer: Self-pay

## 2014-05-12 NOTE — Lactation Note (Signed)
This note was copied from the chart of Katie Gwen HerLatoya Hanlon. Lactation Consultation Note  Assisted with first breastfeeding.  Mom is pumping and supply is good.  Assisted with positioning baby in football hold on right breast.  Breast is full and areola is easily compressed.  Milk easily hand expressed.  Baby latches easily but comes off fussy after 2-3 sucks.  20 mm nipple shield applied and baby sustained latch.  Baby nursed actively with many audible swallows.  Baby paced himself throughout feeding.  Oxygen saturations were 99-100% during feed.  Explained to mom that pre and post weights will be helpful in assessing milk transfer and amount needed for supplementation.  Will continue to follow.  Patient Name: Katie Rosales WUJWJ'XToday'Rosales Date: 05/12/2014 Reason for consult: Follow-up assessment;NICU baby;Infant < 6lbs   Maternal Data    Feeding Feeding Type: Breast Fed Length of feed: 20 min  LATCH Score/Interventions Latch: Grasps breast easily, tongue down, lips flanged, rhythmical sucking. (WITH 20 MM NIPPLE SHIELD)  Audible Swallowing: Spontaneous and intermittent  Type of Nipple: Everted at rest and after stimulation  Comfort (Breast/Nipple): Soft / non-tender     Hold (Positioning): Assistance needed to correctly position infant at breast and maintain latch. Intervention(Rosales): Breastfeeding basics reviewed;Support Pillows;Position options;Skin to skin  LATCH Score: 9  Lactation Tools Discussed/Used     Consult Status      Katie Rosales, Katie Rosales 05/12/2014, 3:31 PM

## 2014-05-16 ENCOUNTER — Encounter (HOSPITAL_COMMUNITY): Payer: PRIVATE HEALTH INSURANCE

## 2014-06-11 ENCOUNTER — Encounter (HOSPITAL_COMMUNITY)
Admission: RE | Admit: 2014-06-11 | Discharge: 2014-06-11 | Disposition: A | Discharge status: Home / Self Care | Payer: PRIVATE HEALTH INSURANCE | Source: Ambulatory Visit | Attending: Obstetrics and Gynecology | Admitting: Obstetrics and Gynecology

## 2014-06-11 DIAGNOSIS — O923 Agalactia: Secondary | ICD-10-CM | POA: Insufficient documentation

## 2014-07-12 ENCOUNTER — Encounter (HOSPITAL_COMMUNITY)
Admission: RE | Admit: 2014-07-12 | Discharge: 2014-07-12 | Disposition: A | Discharge status: Home / Self Care | Payer: PRIVATE HEALTH INSURANCE | Source: Ambulatory Visit | Attending: Obstetrics and Gynecology | Admitting: Obstetrics and Gynecology

## 2014-07-12 DIAGNOSIS — O923 Agalactia: Secondary | ICD-10-CM | POA: Insufficient documentation

## 2014-08-12 ENCOUNTER — Encounter (HOSPITAL_COMMUNITY)
Admission: RE | Admit: 2014-08-12 | Discharge: 2014-08-12 | Disposition: A | Discharge status: Home / Self Care | Payer: Self-pay | Source: Ambulatory Visit | Attending: Obstetrics and Gynecology | Admitting: Obstetrics and Gynecology

## 2014-08-12 DIAGNOSIS — O923 Agalactia: Secondary | ICD-10-CM | POA: Insufficient documentation

## 2014-09-12 ENCOUNTER — Encounter (HOSPITAL_COMMUNITY)
Admission: RE | Admit: 2014-09-12 | Discharge: 2014-09-12 | Disposition: A | Discharge status: Home / Self Care | Payer: PRIVATE HEALTH INSURANCE | Source: Ambulatory Visit | Attending: Obstetrics and Gynecology | Admitting: Obstetrics and Gynecology

## 2014-10-12 ENCOUNTER — Encounter (HOSPITAL_COMMUNITY)
Admission: RE | Admit: 2014-10-12 | Discharge: 2014-10-12 | Disposition: A | Discharge status: Home / Self Care | Payer: PRIVATE HEALTH INSURANCE | Source: Ambulatory Visit | Attending: Obstetrics and Gynecology | Admitting: Obstetrics and Gynecology

## 2014-11-12 ENCOUNTER — Encounter (HOSPITAL_COMMUNITY)
Admission: RE | Admit: 2014-11-12 | Discharge: 2014-11-12 | Disposition: A | Discharge status: Home / Self Care | Payer: PRIVATE HEALTH INSURANCE | Source: Ambulatory Visit | Attending: Obstetrics and Gynecology | Admitting: Obstetrics and Gynecology

## 2014-11-28 ENCOUNTER — Ambulatory Visit: Payer: PRIVATE HEALTH INSURANCE | Admitting: Family

## 2014-11-28 DIAGNOSIS — Z0289 Encounter for other administrative examinations: Secondary | ICD-10-CM

## 2014-12-12 ENCOUNTER — Encounter (HOSPITAL_COMMUNITY)
Admission: RE | Admit: 2014-12-12 | Discharge: 2014-12-12 | Disposition: A | Discharge status: Home / Self Care | Payer: PRIVATE HEALTH INSURANCE | Source: Ambulatory Visit | Attending: Obstetrics and Gynecology | Admitting: Obstetrics and Gynecology

## 2014-12-17 ENCOUNTER — Encounter (HOSPITAL_COMMUNITY): Payer: Self-pay | Admitting: *Deleted

## 2014-12-17 ENCOUNTER — Emergency Department (INDEPENDENT_AMBULATORY_CARE_PROVIDER_SITE_OTHER)
Admission: EM | Admit: 2014-12-17 | Discharge: 2014-12-17 | Disposition: A | Discharge status: Home / Self Care | Payer: BLUE CROSS/BLUE SHIELD | Source: Home / Self Care | Attending: Emergency Medicine | Admitting: Emergency Medicine

## 2014-12-17 DIAGNOSIS — J40 Bronchitis, not specified as acute or chronic: Secondary | ICD-10-CM

## 2014-12-17 LAB — POCT RAPID STREP A: STREPTOCOCCUS, GROUP A SCREEN (DIRECT): NEGATIVE

## 2014-12-17 MED ORDER — AZITHROMYCIN 250 MG PO TABS: ORAL_TABLET | ORAL | Status: DC

## 2014-12-17 MED ORDER — PREDNISONE 50 MG PO TABS: ORAL_TABLET | ORAL | Status: DC

## 2014-12-17 NOTE — Discharge Instructions (Signed)
You have bronchitis. Take azithromycin and prednisone as prescribed. Use honey as needed for cough. You should see improvement in the next 2 days. If you develop fevers, difficulty breathing, or are just not getting better, please come back or go to the emergency room.

## 2014-12-17 NOTE — ED Provider Notes (Signed)
CSN: [id number]     Arrival date & time 12/17/14  1958 History   First MD Initiated Contact with Patient 12/17/14 2017     Chief Complaint  Patient presents with  . Sore Throat   (Consider location/radiation/quality/duration/timing/severity/associated sxs/prior Treatment) HPI  She is a 29 year old woman here for evaluation of sore throat and cough. Her symptoms started 4 days ago with nasal congestion, rhinorrhea, postnasal drainage, and sore throat. She also reports a cough. Her nasal symptoms have resolved, but she continues to have a cough and sore throat. She also reports sounding hoarse. She states she will feel a little short of breath when she goes out into the cold air. She also reports some intermittent wheezing. No fevers or chills. No nausea or vomiting. She has tried several over-the-counter medications without improvement.  Past Medical History  Diagnosis Date  . Vaginal Pap smear, abnormal   . Heart murmur   . Scoliosis    Past Surgical History  Procedure Laterality Date  . Colposcopy    . Dilation and curettage of uterus     Family History  Problem Relation Age of Onset  . Diabetes Mother   . Hypertension Mother   . Cancer Father   . Sickle cell trait Sister   . Aneurysm Maternal Grandmother   . Cancer Maternal Grandfather   . Diabetes Paternal Grandmother    Social History  Substance Use Topics  . Smoking status: Never Smoker   . Smokeless tobacco: Never Used  . Alcohol Use: No   OB History    Gravida Para Term Preterm AB TAB SAB Ectopic Multiple Living   4 2 1 1 2 1 1   0 1     Review of Systems As in history of present illness Allergies  Review of patient's allergies indicates no known allergies.  Home Medications   Prior to Admission medications   Medication Sig Start Date End Date Taking? Authorizing Provider  azithromycin (ZITHROMAX Z-PAK) 250 MG tablet Take 2 pills today, then 1 pill daily until gone. 12/17/14   Charm RingsErin J Collen Hostler, MD  predniSONE  (DELTASONE) 50 MG tablet Take 1 pill daily for 5 days. 12/17/14   Charm RingsErin J Sruti Ayllon, MD  Prenatal Vit-Fe Fumarate-FA (PRENATAL MULTIVITAMIN) TABS tablet Take 1 tablet by mouth daily at 12 noon.    Historical Provider, MD   Meds Ordered and Administered this Visit  Medications - No data to display  BP 141/93 mmHg  Pulse 90  Temp(Src) 98.1 F (36.7 C) (Oral)  SpO2 97% No data found.   Physical Exam  Constitutional: She is oriented to person, place, and time. She appears well-developed and well-nourished. No distress.  HENT:  Mild oropharyngeal erythema  Neck: Neck supple.  Cardiovascular: Normal rate, regular rhythm and normal heart sounds.   No murmur heard. Pulmonary/Chest: Effort normal and breath sounds normal. No respiratory distress. She has no wheezes. She has no rales.  Lymphadenopathy:    She has no cervical adenopathy.  Neurological: She is alert and oriented to person, place, and time.    ED Course  Procedures (including critical care time)  Labs Review Labs Reviewed  POCT RAPID STREP A    Imaging Review No results found.    MDM   1. Bronchitis    History is concerning for bronchitis. Treat with prednisone and azithromycin. Follow-up as needed.    Charm RingsErin J Andrew Soria, MD 12/17/14 2029

## 2014-12-17 NOTE — ED Notes (Signed)
Pt    Reports   Symptoms     Of  sorethroat             Pain  When              Swallows            symptoms  Not  releived  By otc  meds

## 2014-12-20 LAB — CULTURE, GROUP A STREP: STREP A CULTURE: NEGATIVE

## 2015-01-12 ENCOUNTER — Encounter (HOSPITAL_COMMUNITY)
Admission: RE | Admit: 2015-01-12 | Discharge: 2015-01-12 | Disposition: A | Discharge status: Home / Self Care | Payer: Self-pay | Source: Ambulatory Visit | Attending: Obstetrics and Gynecology | Admitting: Obstetrics and Gynecology

## 2015-02-14 ENCOUNTER — Encounter (HOSPITAL_COMMUNITY)
Admission: RE | Admit: 2015-02-14 | Discharge: 2015-02-14 | Disposition: A | Discharge status: Home / Self Care | Payer: Self-pay | Source: Ambulatory Visit | Attending: Obstetrics and Gynecology | Admitting: Obstetrics and Gynecology

## 2015-03-16 ENCOUNTER — Encounter (HOSPITAL_COMMUNITY)
Admission: RE | Admit: 2015-03-16 | Discharge: 2015-03-16 | Disposition: A | Discharge status: Home / Self Care | Payer: Self-pay | Source: Ambulatory Visit | Attending: Obstetrics and Gynecology | Admitting: Obstetrics and Gynecology

## 2015-04-16 ENCOUNTER — Encounter (HOSPITAL_COMMUNITY)
Admission: RE | Admit: 2015-04-16 | Discharge: 2015-04-16 | Disposition: A | Discharge status: Home / Self Care | Payer: Self-pay | Source: Ambulatory Visit | Attending: Obstetrics and Gynecology | Admitting: Obstetrics and Gynecology

## 2015-05-16 ENCOUNTER — Encounter (HOSPITAL_COMMUNITY)
Admission: RE | Admit: 2015-05-16 | Discharge: 2015-05-16 | Disposition: A | Discharge status: Home / Self Care | Payer: Self-pay | Source: Ambulatory Visit | Attending: Obstetrics and Gynecology | Admitting: Obstetrics and Gynecology

## 2015-06-17 ENCOUNTER — Encounter (HOSPITAL_COMMUNITY)
Admission: RE | Admit: 2015-06-17 | Discharge: 2015-06-17 | Disposition: A | Discharge status: Home / Self Care | Payer: Self-pay | Source: Ambulatory Visit | Attending: Obstetrics and Gynecology | Admitting: Obstetrics and Gynecology

## 2015-07-17 ENCOUNTER — Encounter (HOSPITAL_COMMUNITY)
Admission: RE | Admit: 2015-07-17 | Discharge: 2015-07-17 | Disposition: A | Discharge status: Home / Self Care | Payer: BLUE CROSS/BLUE SHIELD | Source: Ambulatory Visit | Attending: Obstetrics & Gynecology | Admitting: Obstetrics & Gynecology

## 2015-08-16 ENCOUNTER — Encounter (HOSPITAL_COMMUNITY)
Admission: RE | Admit: 2015-08-16 | Discharge: 2015-08-16 | Disposition: A | Discharge status: Home / Self Care | Payer: BLUE CROSS/BLUE SHIELD | Source: Ambulatory Visit | Attending: Obstetrics & Gynecology | Admitting: Obstetrics & Gynecology

## 2015-09-17 ENCOUNTER — Encounter (HOSPITAL_COMMUNITY)
Admission: RE | Admit: 2015-09-17 | Discharge: 2015-09-17 | Disposition: A | Discharge status: Home / Self Care | Payer: BLUE CROSS/BLUE SHIELD | Source: Ambulatory Visit | Attending: Obstetrics & Gynecology | Admitting: Obstetrics & Gynecology

## 2015-10-09 ENCOUNTER — Encounter (HOSPITAL_BASED_OUTPATIENT_CLINIC_OR_DEPARTMENT_OTHER): Payer: Self-pay | Admitting: Emergency Medicine

## 2015-10-09 ENCOUNTER — Emergency Department (HOSPITAL_BASED_OUTPATIENT_CLINIC_OR_DEPARTMENT_OTHER): Payer: BLUE CROSS/BLUE SHIELD

## 2015-10-09 ENCOUNTER — Emergency Department (HOSPITAL_BASED_OUTPATIENT_CLINIC_OR_DEPARTMENT_OTHER)
Admission: EM | Admit: 2015-10-09 | Discharge: 2015-10-09 | Disposition: A | Discharge status: Home / Self Care | Payer: BLUE CROSS/BLUE SHIELD | Attending: Emergency Medicine | Admitting: Emergency Medicine

## 2015-10-09 DIAGNOSIS — M79601 Pain in right arm: Secondary | ICD-10-CM | POA: Diagnosis present

## 2015-10-09 DIAGNOSIS — R0789 Other chest pain: Secondary | ICD-10-CM | POA: Diagnosis not present

## 2015-10-09 LAB — BASIC METABOLIC PANEL
Anion gap: 6 (ref 5–15)
BUN: 10 mg/dL (ref 6–20)
CALCIUM: 8.9 mg/dL (ref 8.9–10.3)
CO2: 26 mmol/L (ref 22–32)
Chloride: 105 mmol/L (ref 101–111)
Creatinine, Ser: 0.79 mg/dL (ref 0.44–1.00)
Glucose, Bld: 90 mg/dL (ref 65–99)
Potassium: 3.5 mmol/L (ref 3.5–5.1)
SODIUM: 137 mmol/L (ref 135–145)

## 2015-10-09 LAB — CBC WITH DIFFERENTIAL/PLATELET
BASOS ABS: 0 10*3/uL (ref 0.0–0.1)
BASOS PCT: 0 %
EOS ABS: 0.3 10*3/uL (ref 0.0–0.7)
Eosinophils Relative: 4 %
HCT: 33.6 % — ABNORMAL LOW (ref 36.0–46.0)
HEMOGLOBIN: 11.7 g/dL — AB (ref 12.0–15.0)
LYMPHS PCT: 32 %
Lymphs Abs: 2.4 10*3/uL (ref 0.7–4.0)
MCH: 25.8 pg — AB (ref 26.0–34.0)
MCHC: 34.8 g/dL (ref 30.0–36.0)
MCV: 74.2 fL — ABNORMAL LOW (ref 78.0–100.0)
Monocytes Absolute: 0.8 10*3/uL (ref 0.1–1.0)
Monocytes Relative: 10 %
Neutro Abs: 4 10*3/uL (ref 1.7–7.7)
Neutrophils Relative %: 54 %
Platelets: 282 10*3/uL (ref 150–400)
RBC: 4.53 MIL/uL (ref 3.87–5.11)
RDW: 14.3 % (ref 11.5–15.5)
WBC: 7.5 10*3/uL (ref 4.0–10.5)

## 2015-10-09 LAB — TROPONIN I

## 2015-10-09 LAB — PREGNANCY, URINE: PREG TEST UR: NEGATIVE

## 2015-10-09 MED ORDER — SODIUM CHLORIDE 0.9 % IV BOLUS (SEPSIS)
1000.0000 mL | Freq: Once | INTRAVENOUS | Status: AC
  Administered 2015-10-09: 1000 mL via INTRAVENOUS

## 2015-10-09 MED ORDER — IBUPROFEN 400 MG PO TABS
400.0000 mg | ORAL_TABLET | Freq: Once | ORAL | Status: AC
  Administered 2015-10-09: 400 mg via ORAL
  Filled 2015-10-09: qty 1

## 2015-10-09 NOTE — ED Provider Notes (Signed)
[facility] Provider Note   CSN: [id number] Arrival date & time: 10/09/15  1109     History   Chief Complaint Chief Complaint  Patient presents with  . Arm Pain    left    HPI  Blood pressure 115/73, pulse 62, temperature 99.5 F (37.5 C), temperature source Oral, resp. rate 18, height 5' (1.524 m), weight 79.4 kg, SpO2 97 %, unknown if currently breastfeeding.  Katie Rosales is a 30 y.o. female heart in by EMS for evaluation of left-sided chest pain, states that she woke up and had right arm pain this morning, she proceeded to go to work and the pain moved to the left side of her chest and left arm, she states that the pain is exacerbating by moving the left arm. Symptoms are associated with a lightheaded sensation without vertigo or syncope. Denies shortness of breath, cough, history of DVT/PE, recent immobilizations, hormonal birth control, active cancer, family history of early cardiac death, cocaine or methamphetamine use, tobacco use, decreased by mouth intake, melena, hematochezia, abdominal pain.   HPI  Past Medical History:  Diagnosis Date  . Heart murmur   . Scoliosis   . Vaginal Pap smear, abnormal     Patient Active Problem List   Diagnosis Date Noted  . Preterm delivery 04/25/2014  . PROM (premature rupture of membranes) at 31 3/7 weeks 04/24/2014  . History of gestational hypertension 04/24/2014  . Marginal insertion of umbilical cord 04/13/2014  . Hemoglobin C (Hb-C) (HCC) 04/13/2014    Past Surgical History:  Procedure Laterality Date  . COLPOSCOPY    . DILATION AND CURETTAGE OF UTERUS      OB History    Gravida Para Term Preterm AB Living   4 2 1 1 2 1    SAB TAB Ectopic Multiple Live Births   1 1   0 1       Home Medications    Prior to Admission medications   Not on File    Family History Family History  Problem Relation Age of Onset  . Diabetes Mother   . Hypertension Mother   . Cancer Father   . Sickle cell trait  Sister   . Aneurysm Maternal Grandmother   . Cancer Maternal Grandfather   . Diabetes Paternal Grandmother     Social History Social History  Substance Use Topics  . Smoking status: Never Smoker  . Smokeless tobacco: Never Used  . Alcohol use No     Allergies   Review of patient's allergies indicates no known allergies.   Review of Systems Review of Systems  10 systems reviewed and found to be negative, except as noted in the HPI.   Physical Exam Updated Vital Signs BP 115/73   Pulse 62   Temp 99.5 F (37.5 C) (Oral)   Resp 18   Ht 5' (1.524 m)   Wt 79.4 kg   LMP 09/25/2015   SpO2 97%   BMI 34.18 kg/m   Physical Exam  Constitutional: She is oriented to person, place, and time. She appears well-developed and well-nourished. No distress.  HENT:  Head: Normocephalic and atraumatic.  Mouth/Throat: Oropharynx is clear and moist.  Eyes: Conjunctivae and EOM are normal. Pupils are equal, round, and reactive to light.  Neck: Normal range of motion. No JVD present. No tracheal deviation present.  Cardiovascular: Normal rate, regular rhythm and intact distal pulses.   Radial pulse equal bilaterally  Pulmonary/Chest: Effort normal and breath sounds normal. No stridor. No  respiratory distress. She has no wheezes. She has no rales. She exhibits tenderness.    Chest pain reproducible to palpation as diagrammed  Abdominal: Soft. She exhibits no distension and no mass. There is no tenderness. There is no rebound and no guarding.  Musculoskeletal: Normal range of motion. She exhibits no edema or tenderness.  No calf asymmetry, superficial collaterals, palpable cords, edema, Homans sign negative bilaterally.    Neurological: She is alert and oriented to person, place, and time.  Skin: Skin is warm. She is not diaphoretic.  Psychiatric: She has a normal mood and affect.  Nursing note and vitals reviewed.    ED Treatments / Results  Labs (all labs ordered are listed, but  only abnormal results are displayed) Labs Reviewed  CBC WITH DIFFERENTIAL/PLATELET - Abnormal; Notable for the following:       Result Value   Hemoglobin 11.7 (*)    HCT 33.6 (*)    MCV 74.2 (*)    MCH 25.8 (*)    All other components within normal limits  PREGNANCY, URINE  TROPONIN I  BASIC METABOLIC PANEL    EKG  EKG Interpretation  Date/Time:  Tuesday October 09 2015 11:29:43 EDT Ventricular Rate:  62 PR Interval:    QRS Duration: 109 QT Interval:  402 QTC Calculation: 409 R Axis:   53 Text Interpretation:  Sinus rhythm Baseline wander in lead(s) II III aVF V5 Otherwise no significant change No acute ST segment changes Confirmed by Erma HeritageISAACS MD, CAMERON [phone]([id number]) on 10/09/2015 12:21:00 PM       Radiology Dg Chest 2 View  Result Date: 10/09/2015 CLINICAL DATA:  Chest pain. EXAM: CHEST  2 VIEW COMPARISON:  None. FINDINGS: The heart size and mediastinal contours are within normal limits. Both lungs are clear. No pneumothorax or pleural effusion is noted. The visualized skeletal structures are unremarkable. IMPRESSION: No active cardiopulmonary disease. Electronically Signed   By: Lupita RaiderJames  Green Jr, M.D.   On: 10/09/2015 11:49    Procedures Procedures (including critical care time)  Medications Ordered in ED Medications  sodium chloride 0.9 % bolus 1,000 mL (1,000 mLs Intravenous New Bag/Given 10/09/15 1145)  ibuprofen (ADVIL,MOTRIN) tablet 400 mg (400 mg Oral Given 10/09/15 1125)     Initial Impression / Assessment and Plan / ED Course  I have reviewed the triage vital signs and the nursing notes.  Pertinent labs & imaging results that were available during my care of the patient were reviewed by me and considered in my medical decision making (see chart for details).  Clinical Course    Vitals:   10/09/15 1111 10/09/15 1112  BP:  115/73  Pulse:  62  Resp:  18  Temp:  99.5 F (37.5 C)  TempSrc:  Oral  SpO2:  97%  Weight: 79.4 kg   Height: 5' (1.524 m)      Medications  sodium chloride 0.9 % bolus 1,000 mL (1,000 mLs Intravenous New Bag/Given 10/09/15 1145)  ibuprofen (ADVIL,MOTRIN) tablet 400 mg (400 mg Oral Given 10/09/15 1125)    Katie Rosales is 30 y.o. female presenting with Migratory anterior chest pain worse with movement and reproducible to palpation associated with lightheadedness. Patient is low risk by HEAR score, low risk by Wells criteria and PERC negative. Patient will be given ibuprofen for likely costochondritis. Will obtain pregnancy, EKG, orthostatic vital signs.   EKG without acute abnormality, blood work reassuring, troponin negative. Orthostatics without significant abnormality. I think this is likely costochondritis. Patient is encouraged to take high-dose  NSAIDs and follow closely with primary care.  Evaluation does not show pathology that would require ongoing emergent intervention or inpatient treatment. Pt is hemodynamically stable and mentating appropriately. Discussed findings and plan with patient/guardian, who agrees with care plan. All questions answered. Return precautions discussed and outpatient follow up given.      Final Clinical Impressions(s) / ED Diagnoses   Final diagnoses:  Chest wall pain      Wynetta Emery, PA-C 10/09/15 1221    Shaune Pollack, MD 10/10/15 [phone]

## 2015-10-09 NOTE — ED Notes (Signed)
Provider at bedside

## 2015-10-09 NOTE — Discharge Instructions (Signed)
For pain control please take ibuprofen (also known as Motrin or Advil) 800mg (this is normally 4 over the counter pills) 3 times a day  for 5 days. Take with food to minimize stomach irritation. ° ° °Please follow with your primary care doctor in the next 2 days for a check-up. They must obtain records for further management.  ° °Do not hesitate to return to the Emergency Department for any new, worsening or concerning symptoms.  ° °

## 2015-10-09 NOTE — ED Notes (Signed)
Patient transported to X-ray 

## 2015-10-09 NOTE — ED Triage Notes (Signed)
Pt states she woke up with right arm pain that went to left arm at work.  Pt states she has some dizziness left a swimmy headed feeling.

## 2015-10-17 ENCOUNTER — Encounter (HOSPITAL_COMMUNITY)
Admission: RE | Admit: 2015-10-17 | Discharge: 2015-10-17 | Disposition: A | Discharge status: Home / Self Care | Payer: BLUE CROSS/BLUE SHIELD | Source: Ambulatory Visit | Attending: Obstetrics & Gynecology | Admitting: Obstetrics & Gynecology

## 2015-11-16 ENCOUNTER — Encounter (HOSPITAL_COMMUNITY)
Admission: RE | Admit: 2015-11-16 | Discharge: 2015-11-16 | Disposition: A | Discharge status: Home / Self Care | Payer: BLUE CROSS/BLUE SHIELD | Source: Ambulatory Visit | Attending: Obstetrics & Gynecology | Admitting: Obstetrics & Gynecology

## 2015-12-17 ENCOUNTER — Ambulatory Visit (HOSPITAL_COMMUNITY)
Admission: RE | Admit: 2015-12-17 | Discharge: 2015-12-17 | Disposition: A | Discharge status: Home / Self Care | Payer: BLUE CROSS/BLUE SHIELD | Source: Ambulatory Visit | Attending: Obstetrics & Gynecology | Admitting: Obstetrics & Gynecology

## 2016-01-15 ENCOUNTER — Ambulatory Visit (HOSPITAL_COMMUNITY)
Admission: RE | Admit: 2016-01-15 | Discharge: 2016-01-15 | Disposition: A | Discharge status: Home / Self Care | Payer: BLUE CROSS/BLUE SHIELD | Source: Ambulatory Visit | Attending: Obstetrics & Gynecology | Admitting: Obstetrics & Gynecology

## 2017-02-24 IMAGING — CR DG CHEST 2V
2 series · 2 of 2 positions shown · non-contrast
Comparison: None.

CLINICAL DATA: Chest pain.

EXAM:
CHEST  2 VIEW

[w chest pa]
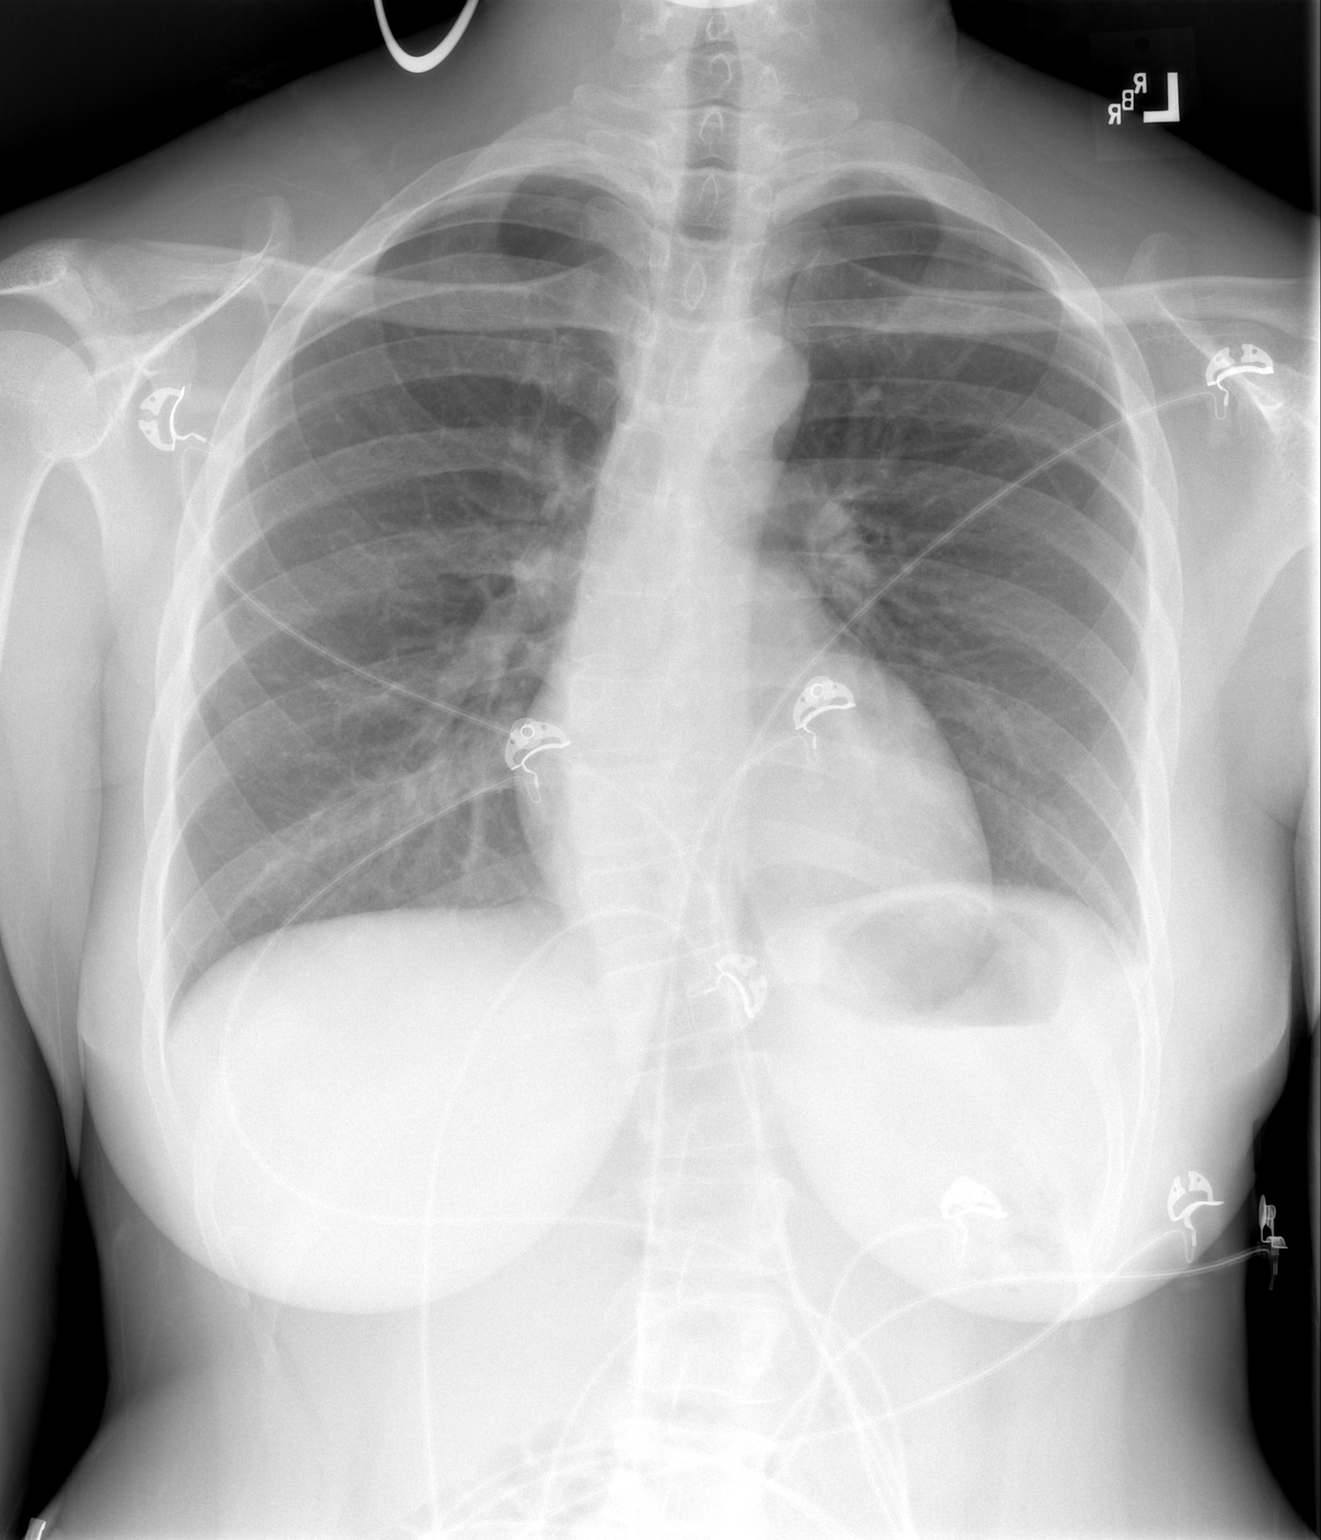

[w chest lat]
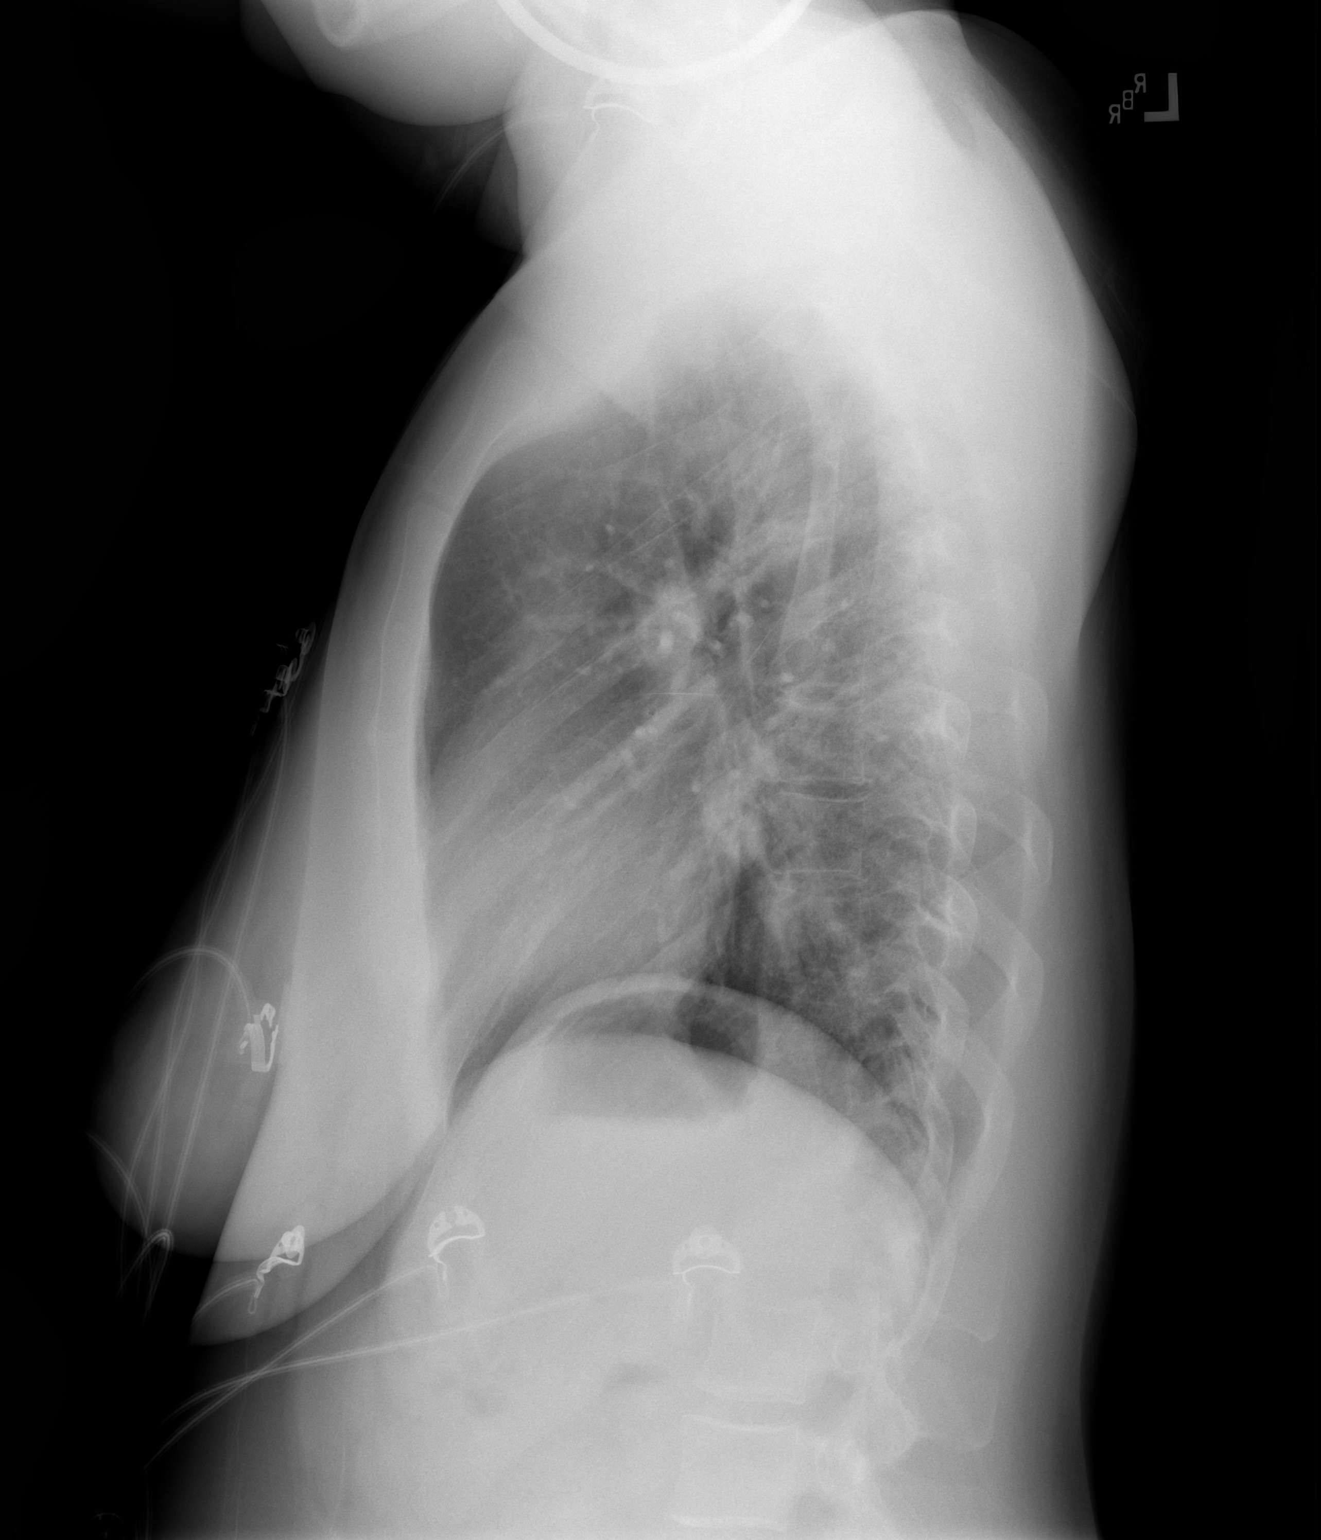

[2 of 2 positions shown; findings below may reference images not displayed]

FINDINGS: The heart size and mediastinal contours are within normal limits.
Both lungs are clear. No pneumothorax or pleural effusion is noted.
The visualized skeletal structures are unremarkable.
IMPRESSION: No active cardiopulmonary disease.
